# Patient Record
Sex: Female | Born: 1935 | Race: White | Hispanic: No | Smoking: Never smoker
Health system: Southern US, Community
[De-identification: ages and names within clinical notes are randomized; demographics above are authoritative.]

## PROBLEM LIST (undated history)

## (undated) ENCOUNTER — Emergency Department (HOSPITAL_BASED_OUTPATIENT_CLINIC_OR_DEPARTMENT_OTHER): Discharge: 2013-02-16 | Discharge status: Against Medical Advice | Payer: Medicare Other

---

## 2019-06-16 ENCOUNTER — Emergency Department (HOSPITAL_COMMUNITY): Payer: Medicare Other | Admitting: Certified Registered Nurse Anesthetist

## 2019-06-16 ENCOUNTER — Encounter (HOSPITAL_COMMUNITY): Payer: Self-pay | Admitting: Neurology

## 2019-06-16 ENCOUNTER — Inpatient Hospital Stay (HOSPITAL_COMMUNITY): Payer: Medicare Other

## 2019-06-16 ENCOUNTER — Emergency Department (HOSPITAL_COMMUNITY): Payer: Medicare Other

## 2019-06-16 ENCOUNTER — Encounter (HOSPITAL_COMMUNITY): Admission: EM | Disposition: E | Payer: Self-pay | Source: Home / Self Care | Attending: Neurology

## 2019-06-16 ENCOUNTER — Inpatient Hospital Stay (HOSPITAL_COMMUNITY)
Admission: EM | Admit: 2019-06-16 | Discharge: 2019-07-16 | DRG: 023 | Disposition: E | Payer: Medicare Other | Attending: Neurology | Admitting: Neurology

## 2019-06-16 DIAGNOSIS — G9349 Other encephalopathy: Secondary | ICD-10-CM | POA: Diagnosis present

## 2019-06-16 DIAGNOSIS — R414 Neurologic neglect syndrome: Secondary | ICD-10-CM | POA: Diagnosis present

## 2019-06-16 DIAGNOSIS — I495 Sick sinus syndrome: Secondary | ICD-10-CM | POA: Diagnosis present

## 2019-06-16 DIAGNOSIS — Z9181 History of falling: Secondary | ICD-10-CM

## 2019-06-16 DIAGNOSIS — I619 Nontraumatic intracerebral hemorrhage, unspecified: Secondary | ICD-10-CM | POA: Diagnosis present

## 2019-06-16 DIAGNOSIS — I350 Nonrheumatic aortic (valve) stenosis: Secondary | ICD-10-CM | POA: Diagnosis not present

## 2019-06-16 DIAGNOSIS — I272 Pulmonary hypertension, unspecified: Secondary | ICD-10-CM | POA: Diagnosis present

## 2019-06-16 DIAGNOSIS — I69391 Dysphagia following cerebral infarction: Secondary | ICD-10-CM

## 2019-06-16 DIAGNOSIS — I63411 Cerebral infarction due to embolism of right middle cerebral artery: Secondary | ICD-10-CM

## 2019-06-16 DIAGNOSIS — I5032 Chronic diastolic (congestive) heart failure: Secondary | ICD-10-CM | POA: Diagnosis present

## 2019-06-16 DIAGNOSIS — Z79899 Other long term (current) drug therapy: Secondary | ICD-10-CM

## 2019-06-16 DIAGNOSIS — E43 Unspecified severe protein-calorie malnutrition: Secondary | ICD-10-CM | POA: Diagnosis present

## 2019-06-16 DIAGNOSIS — Z7982 Long term (current) use of aspirin: Secondary | ICD-10-CM

## 2019-06-16 DIAGNOSIS — Z79890 Hormone replacement therapy (postmenopausal): Secondary | ICD-10-CM

## 2019-06-16 DIAGNOSIS — Z515 Encounter for palliative care: Secondary | ICD-10-CM | POA: Diagnosis not present

## 2019-06-16 DIAGNOSIS — Z20822 Contact with and (suspected) exposure to covid-19: Secondary | ICD-10-CM | POA: Diagnosis present

## 2019-06-16 DIAGNOSIS — R29810 Facial weakness: Secondary | ICD-10-CM | POA: Diagnosis present

## 2019-06-16 DIAGNOSIS — Z9981 Dependence on supplemental oxygen: Secondary | ICD-10-CM | POA: Diagnosis not present

## 2019-06-16 DIAGNOSIS — H409 Unspecified glaucoma: Secondary | ICD-10-CM | POA: Diagnosis present

## 2019-06-16 DIAGNOSIS — I420 Dilated cardiomyopathy: Secondary | ICD-10-CM | POA: Diagnosis present

## 2019-06-16 DIAGNOSIS — I482 Chronic atrial fibrillation, unspecified: Secondary | ICD-10-CM | POA: Diagnosis present

## 2019-06-16 DIAGNOSIS — I4891 Unspecified atrial fibrillation: Secondary | ICD-10-CM | POA: Diagnosis not present

## 2019-06-16 DIAGNOSIS — M8580 Other specified disorders of bone density and structure, unspecified site: Secondary | ICD-10-CM | POA: Diagnosis present

## 2019-06-16 DIAGNOSIS — I340 Nonrheumatic mitral (valve) insufficiency: Secondary | ICD-10-CM | POA: Diagnosis present

## 2019-06-16 DIAGNOSIS — G8194 Hemiplegia, unspecified affecting left nondominant side: Secondary | ICD-10-CM | POA: Diagnosis present

## 2019-06-16 DIAGNOSIS — Z978 Presence of other specified devices: Secondary | ICD-10-CM

## 2019-06-16 DIAGNOSIS — R7303 Prediabetes: Secondary | ICD-10-CM | POA: Diagnosis present

## 2019-06-16 DIAGNOSIS — I63511 Cerebral infarction due to unspecified occlusion or stenosis of right middle cerebral artery: Secondary | ICD-10-CM | POA: Diagnosis present

## 2019-06-16 DIAGNOSIS — R29719 NIHSS score 19: Secondary | ICD-10-CM | POA: Diagnosis present

## 2019-06-16 DIAGNOSIS — I959 Hypotension, unspecified: Secondary | ICD-10-CM | POA: Diagnosis not present

## 2019-06-16 DIAGNOSIS — Z0189 Encounter for other specified special examinations: Secondary | ICD-10-CM

## 2019-06-16 DIAGNOSIS — Z1389 Encounter for screening for other disorder: Secondary | ICD-10-CM

## 2019-06-16 DIAGNOSIS — I6389 Other cerebral infarction: Secondary | ICD-10-CM | POA: Diagnosis not present

## 2019-06-16 DIAGNOSIS — I10 Essential (primary) hypertension: Secondary | ICD-10-CM | POA: Diagnosis not present

## 2019-06-16 DIAGNOSIS — C7951 Secondary malignant neoplasm of bone: Secondary | ICD-10-CM | POA: Diagnosis present

## 2019-06-16 DIAGNOSIS — Z66 Do not resuscitate: Secondary | ICD-10-CM | POA: Diagnosis not present

## 2019-06-16 DIAGNOSIS — J9601 Acute respiratory failure with hypoxia: Secondary | ICD-10-CM | POA: Diagnosis present

## 2019-06-16 DIAGNOSIS — E872 Acidosis: Secondary | ICD-10-CM | POA: Diagnosis present

## 2019-06-16 DIAGNOSIS — J9690 Respiratory failure, unspecified, unspecified whether with hypoxia or hypercapnia: Secondary | ICD-10-CM

## 2019-06-16 DIAGNOSIS — R471 Dysarthria and anarthria: Secondary | ICD-10-CM | POA: Diagnosis present

## 2019-06-16 DIAGNOSIS — I361 Nonrheumatic tricuspid (valve) insufficiency: Secondary | ICD-10-CM | POA: Diagnosis not present

## 2019-06-16 DIAGNOSIS — I639 Cerebral infarction, unspecified: Secondary | ICD-10-CM | POA: Diagnosis present

## 2019-06-16 DIAGNOSIS — I110 Hypertensive heart disease with heart failure: Secondary | ICD-10-CM | POA: Diagnosis present

## 2019-06-16 DIAGNOSIS — E785 Hyperlipidemia, unspecified: Secondary | ICD-10-CM | POA: Diagnosis present

## 2019-06-16 DIAGNOSIS — R1310 Dysphagia, unspecified: Secondary | ICD-10-CM | POA: Diagnosis present

## 2019-06-16 DIAGNOSIS — R54 Age-related physical debility: Secondary | ICD-10-CM | POA: Diagnosis present

## 2019-06-16 DIAGNOSIS — I9589 Other hypotension: Secondary | ICD-10-CM | POA: Diagnosis not present

## 2019-06-16 DIAGNOSIS — D72829 Elevated white blood cell count, unspecified: Secondary | ICD-10-CM | POA: Diagnosis not present

## 2019-06-16 DIAGNOSIS — I160 Hypertensive urgency: Secondary | ICD-10-CM | POA: Diagnosis present

## 2019-06-16 DIAGNOSIS — E039 Hypothyroidism, unspecified: Secondary | ICD-10-CM | POA: Diagnosis present

## 2019-06-16 HISTORY — PX: IR PERCUTANEOUS ART THROMBECTOMY/INFUSION INTRACRANIAL INC DIAG ANGIO: IMG6087

## 2019-06-16 HISTORY — PX: RADIOLOGY WITH ANESTHESIA: SHX6223

## 2019-06-16 HISTORY — PX: IR CT HEAD LTD: IMG2386

## 2019-06-16 LAB — CBC
HCT: 45.6 % (ref 36.0–46.0)
Hemoglobin: 14.9 g/dL (ref 12.0–15.0)
MCH: 28.9 pg (ref 26.0–34.0)
MCHC: 32.7 g/dL (ref 30.0–36.0)
MCV: 88.5 fL (ref 80.0–100.0)
Platelets: 184 10*3/uL (ref 150–400)
RBC: 5.15 MIL/uL — ABNORMAL HIGH (ref 3.87–5.11)
RDW: 13.8 % (ref 11.5–15.5)
WBC: 8.1 10*3/uL (ref 4.0–10.5)
nRBC: 0 % (ref 0.0–0.2)

## 2019-06-16 LAB — DIFFERENTIAL
Abs Immature Granulocytes: 0.06 10*3/uL (ref 0.00–0.07)
Basophils Absolute: 0 10*3/uL (ref 0.0–0.1)
Basophils Relative: 1 %
Eosinophils Absolute: 0 10*3/uL (ref 0.0–0.5)
Eosinophils Relative: 0 %
Immature Granulocytes: 1 %
Lymphocytes Relative: 8 %
Lymphs Abs: 0.7 10*3/uL (ref 0.7–4.0)
Monocytes Absolute: 0.3 10*3/uL (ref 0.1–1.0)
Monocytes Relative: 4 %
Neutro Abs: 7 10*3/uL (ref 1.7–7.7)
Neutrophils Relative %: 86 %

## 2019-06-16 LAB — COMPREHENSIVE METABOLIC PANEL
ALT: 17 U/L (ref 0–44)
AST: 22 U/L (ref 15–41)
Albumin: 3.6 g/dL (ref 3.5–5.0)
Alkaline Phosphatase: 57 U/L (ref 38–126)
Anion gap: 15 (ref 5–15)
BUN: 14 mg/dL (ref 8–23)
CO2: 21 mmol/L — ABNORMAL LOW (ref 22–32)
Calcium: 8.9 mg/dL (ref 8.9–10.3)
Chloride: 96 mmol/L — ABNORMAL LOW (ref 98–111)
Creatinine, Ser: 0.78 mg/dL (ref 0.44–1.00)
GFR calc Af Amer: >60 mL/min (ref >60)
GFR calc non Af Amer: >60 mL/min (ref >60)
Glucose, Bld: 143 mg/dL — ABNORMAL HIGH (ref 70–99)
Potassium: 4.2 mmol/L (ref 3.5–5.1)
Sodium: 132 mmol/L — ABNORMAL LOW (ref 135–145)
Total Bilirubin: 1.7 mg/dL — ABNORMAL HIGH (ref 0.3–1.2)
Total Protein: 6.7 g/dL (ref 6.5–8.1)

## 2019-06-16 LAB — RAPID URINE DRUG SCREEN, HOSP PERFORMED
Amphetamines: NOT DETECTED
Barbiturates: NOT DETECTED
Benzodiazepines: NOT DETECTED
Cocaine: NOT DETECTED
Opiates: NOT DETECTED
Tetrahydrocannabinol: NOT DETECTED

## 2019-06-16 LAB — URINALYSIS, ROUTINE W REFLEX MICROSCOPIC
Bacteria, UA: NONE SEEN
Bilirubin Urine: NEGATIVE
Glucose, UA: 50 mg/dL — AB
Ketones, ur: 20 mg/dL — AB
Nitrite: NEGATIVE
Protein, ur: 100 mg/dL — AB
Specific Gravity, Urine: 1.039 — ABNORMAL HIGH (ref 1.005–1.030)
pH: 7 (ref 5.0–8.0)

## 2019-06-16 LAB — ETHANOL: Alcohol, Ethyl (B): <10 mg/dL (ref <10)

## 2019-06-16 LAB — PROTIME-INR
INR: 1.1 (ref 0.8–1.2)
Prothrombin Time: 13.3 seconds (ref 11.4–15.2)

## 2019-06-16 LAB — ACETAMINOPHEN LEVEL: Acetaminophen (Tylenol), Serum: <10 ug/mL — ABNORMAL LOW (ref 10–30)

## 2019-06-16 LAB — SALICYLATE LEVEL: Salicylate Lvl: <7 mg/dL — ABNORMAL LOW (ref 7.0–30.0)

## 2019-06-16 LAB — LACTIC ACID, PLASMA: Lactic Acid, Venous: 2.3 mmol/L (ref 0.5–1.9)

## 2019-06-16 LAB — TSH: TSH: 2.977 u[IU]/mL (ref 0.350–4.500)

## 2019-06-16 LAB — APTT: aPTT: 35 seconds (ref 24–36)

## 2019-06-16 LAB — MRSA PCR SCREENING: MRSA by PCR: NEGATIVE

## 2019-06-16 LAB — SARS CORONAVIRUS 2 BY RT PCR (HOSPITAL ORDER, PERFORMED IN ~~LOC~~ HOSPITAL LAB): SARS Coronavirus 2: NEGATIVE

## 2019-06-16 SURGERY — IR WITH ANESTHESIA
Anesthesia: General

## 2019-06-16 MED ORDER — DILTIAZEM HCL ER COATED BEADS 240 MG PO CP24
240.0000 mg | ORAL_CAPSULE | Freq: Every day | ORAL | Status: DC
  Filled 2019-06-16: qty 1

## 2019-06-16 MED ORDER — SUCCINYLCHOLINE CHLORIDE 200 MG/10ML IV SOSY
PREFILLED_SYRINGE | INTRAVENOUS | Status: DC | PRN
  Administered 2019-06-16: 100 mg via INTRAVENOUS

## 2019-06-16 MED ORDER — ACETAMINOPHEN 160 MG/5ML PO SOLN
650.0000 mg | ORAL | Status: DC | PRN
  Administered 2019-06-18: 650 mg
  Filled 2019-06-16: qty 20.3

## 2019-06-16 MED ORDER — PHENYLEPHRINE HCL-NACL 10-0.9 MG/250ML-% IV SOLN
INTRAVENOUS | Status: DC | PRN
  Administered 2019-06-16: 20 ug/min via INTRAVENOUS

## 2019-06-16 MED ORDER — METOPROLOL SUCCINATE ER 25 MG PO TB24
25.0000 mg | ORAL_TABLET | Freq: Every day | ORAL | Status: DC
  Filled 2019-06-16: qty 1

## 2019-06-16 MED ORDER — CHLORHEXIDINE GLUCONATE 0.12% ORAL RINSE (MEDLINE KIT)
15.0000 mL | Freq: Two times a day (BID) | OROMUCOSAL | Status: DC
  Administered 2019-06-16 – 2019-06-21 (×11): 15 mL via OROMUCOSAL

## 2019-06-16 MED ORDER — EPTIFIBATIDE 20 MG/10ML IV SOLN
INTRAVENOUS | Status: AC
  Filled 2019-06-16: qty 10

## 2019-06-16 MED ORDER — ACETAMINOPHEN 325 MG PO TABS: 650.0000 mg | ORAL_TABLET | ORAL | Status: DC | PRN

## 2019-06-16 MED ORDER — CHLORHEXIDINE GLUCONATE CLOTH 2 % EX PADS
6.0000 | MEDICATED_PAD | Freq: Every day | CUTANEOUS | Status: DC
  Administered 2019-06-16 – 2019-06-21 (×6): 6 via TOPICAL

## 2019-06-16 MED ORDER — CLOPIDOGREL BISULFATE 300 MG PO TABS
ORAL_TABLET | ORAL | Status: AC
  Filled 2019-06-16: qty 1

## 2019-06-16 MED ORDER — TIMOLOL MALEATE 0.5 % OP SOLN
1.0000 [drp] | Freq: Two times a day (BID) | OPHTHALMIC | Status: DC
  Administered 2019-06-17 – 2019-06-21 (×10): 1 [drp] via OPHTHALMIC
  Filled 2019-06-16: qty 5

## 2019-06-16 MED ORDER — BRIMONIDINE TARTRATE 0.2 % OP SOLN
1.0000 [drp] | Freq: Two times a day (BID) | OPHTHALMIC | Status: DC
  Administered 2019-06-16 – 2019-06-21 (×11): 1 [drp] via OPHTHALMIC
  Filled 2019-06-16: qty 5

## 2019-06-16 MED ORDER — LEVOTHYROXINE SODIUM 75 MCG PO TABS: 37.5000 ug | ORAL_TABLET | Freq: Every day | ORAL | Status: DC

## 2019-06-16 MED ORDER — SODIUM CHLORIDE 0.9 % IV BOLUS
500.0000 mL | Freq: Once | INTRAVENOUS | Status: AC
  Administered 2019-06-17: 500 mL via INTRAVENOUS

## 2019-06-16 MED ORDER — PROPOFOL 10 MG/ML IV BOLUS
INTRAVENOUS | Status: DC | PRN
  Administered 2019-06-16: 100 mg via INTRAVENOUS

## 2019-06-16 MED ORDER — PROPOFOL 500 MG/50ML IV EMUL
INTRAVENOUS | Status: DC | PRN
  Administered 2019-06-16: 30 ug/kg/min via INTRAVENOUS

## 2019-06-16 MED ORDER — IOHEXOL 240 MG/ML SOLN
150.0000 mL | Freq: Once | INTRAMUSCULAR | Status: AC | PRN
  Administered 2019-06-16: 10 mL via INTRA_ARTERIAL

## 2019-06-16 MED ORDER — ACETAMINOPHEN 650 MG RE SUPP: 650.0000 mg | RECTAL | Status: DC | PRN

## 2019-06-16 MED ORDER — CLEVIDIPINE BUTYRATE 0.5 MG/ML IV EMUL
0.0000 mg/h | INTRAVENOUS | Status: DC
  Administered 2019-06-16: 1 mg/h via INTRAVENOUS
  Filled 2019-06-16: qty 50

## 2019-06-16 MED ORDER — ONDANSETRON HCL 4 MG/2ML IJ SOLN
INTRAMUSCULAR | Status: AC
  Filled 2019-06-16: qty 2

## 2019-06-16 MED ORDER — VERAPAMIL HCL 2.5 MG/ML IV SOLN
INTRAVENOUS | Status: AC
  Filled 2019-06-16: qty 2

## 2019-06-16 MED ORDER — SODIUM CHLORIDE 0.9 % IV SOLN: INTRAVENOUS | Status: DC

## 2019-06-16 MED ORDER — STROKE: EARLY STAGES OF RECOVERY BOOK
Freq: Once | Status: DC
  Filled 2019-06-16: qty 1

## 2019-06-16 MED ORDER — IOHEXOL 300 MG/ML  SOLN
50.0000 mL | Freq: Once | INTRAMUSCULAR | Status: AC | PRN
  Administered 2019-06-16: 50 mL via INTRA_ARTERIAL

## 2019-06-16 MED ORDER — TICAGRELOR 90 MG PO TABS
ORAL_TABLET | ORAL | Status: AC
  Filled 2019-06-16: qty 2

## 2019-06-16 MED ORDER — SENNOSIDES-DOCUSATE SODIUM 8.6-50 MG PO TABS: 1.0000 | ORAL_TABLET | Freq: Every evening | ORAL | Status: DC | PRN

## 2019-06-16 MED ORDER — PROPOFOL 1000 MG/100ML IV EMUL
0.0000 ug/kg/min | INTRAVENOUS | Status: DC
  Administered 2019-06-16: 30 ug/kg/min via INTRAVENOUS
  Administered 2019-06-17: 10 ug/kg/min via INTRAVENOUS
  Administered 2019-06-17: 25 ug/kg/min via INTRAVENOUS
  Filled 2019-06-16 (×4): qty 100

## 2019-06-16 MED ORDER — PANTOPRAZOLE SODIUM 40 MG IV SOLR
40.0000 mg | INTRAVENOUS | Status: DC
  Administered 2019-06-16 – 2019-06-17 (×2): 40 mg via INTRAVENOUS
  Filled 2019-06-16 (×2): qty 40

## 2019-06-16 MED ORDER — ROCURONIUM BROMIDE 10 MG/ML (PF) SYRINGE
PREFILLED_SYRINGE | INTRAVENOUS | Status: DC | PRN
  Administered 2019-06-16: 40 mg via INTRAVENOUS

## 2019-06-16 MED ORDER — TIROFIBAN HCL IN NACL 5-0.9 MG/100ML-% IV SOLN
INTRAVENOUS | Status: AC
  Filled 2019-06-16: qty 100

## 2019-06-16 MED ORDER — IOHEXOL 350 MG/ML SOLN
100.0000 mL | Freq: Once | INTRAVENOUS | Status: AC | PRN
  Administered 2019-06-16: 100 mL via INTRAVENOUS

## 2019-06-16 MED ORDER — ORAL CARE MOUTH RINSE
15.0000 mL | OROMUCOSAL | Status: DC
  Administered 2019-06-16 – 2019-06-21 (×52): 15 mL via OROMUCOSAL

## 2019-06-16 MED ORDER — LATANOPROST 0.005 % OP SOLN
1.0000 [drp] | Freq: Every day | OPHTHALMIC | Status: DC
  Administered 2019-06-17 – 2019-06-21 (×5): 1 [drp] via OPHTHALMIC
  Filled 2019-06-16: qty 2.5

## 2019-06-16 MED ORDER — IOHEXOL 240 MG/ML SOLN
INTRAMUSCULAR | Status: AC
  Filled 2019-06-16: qty 200

## 2019-06-16 MED ORDER — CLEVIDIPINE BUTYRATE 0.5 MG/ML IV EMUL
INTRAVENOUS | Status: AC
  Administered 2019-06-16: 1 mg/h via INTRAVENOUS
  Filled 2019-06-16: qty 50

## 2019-06-16 MED ORDER — INSULIN ASPART 100 UNIT/ML ~~LOC~~ SOLN
2.0000 [IU] | SUBCUTANEOUS | Status: DC
  Administered 2019-06-16 – 2019-06-17 (×3): 2 [IU] via SUBCUTANEOUS
  Administered 2019-06-17: 4 [IU] via SUBCUTANEOUS
  Administered 2019-06-17 – 2019-06-18 (×3): 2 [IU] via SUBCUTANEOUS
  Administered 2019-06-18: 4 [IU] via SUBCUTANEOUS
  Administered 2019-06-19 – 2019-06-21 (×11): 2 [IU] via SUBCUTANEOUS
  Administered 2019-06-21: 4 [IU] via SUBCUTANEOUS

## 2019-06-16 MED ORDER — ASPIRIN 81 MG PO CHEW
CHEWABLE_TABLET | ORAL | Status: AC
  Filled 2019-06-16: qty 1

## 2019-06-16 NOTE — Procedures (Signed)
INTERVENTIONAL NEURORADIOLOGY BRIEF POSTPROCEDURE NOTE  DIAGNOSTIC CEREBRAL ANGIOGRAM AND MECHANICAL THROMBECTOMY  Attending: Dr. Pedro Earls  Assistant: None  Diagnosis: Right M1/MCA occlusion  Access site: Right common femoral artery  Access closure: 58F angioseal  Anesthesia: General  Medication used: refer to anesthesia documentation.  Complications: None  Estimated blood loss: 100 mL.  Specimen: None  Findings: There was a mid M1/MCA occlusion. MT performed with combined aspiration + stent retriever Scientist, research (life sciences)). Total 1 pass with complete recanalization (TICI3). No embolus to new territory. No bleed on post procedural flat panel CT.  The patient tolerated the procedure well without incident or complication and is in stable condition.  Patient remained intubated due to unknown covid-19 status.

## 2019-06-16 NOTE — Progress Notes (Signed)
Called by Dr. Jeannine Boga in radiology with a concern for basal ganglia bleed in the revascularized territory He is recommending CT head for follow-up.  I will order CT head.  -- Amie Portland, MD [facility] Pager: [phone] If 7pm to 7am, please call on call as listed on AMION.

## 2019-06-16 NOTE — Anesthesia Preprocedure Evaluation (Signed)
Anesthesia Evaluation  Patient identified by MRN, date of birth, ID bandPreop documentation limited or incomplete due to emergent nature of procedure.  Airway Mallampati: II   Neck ROM: full    Dental   Pulmonary    breath sounds clear to auscultation       Cardiovascular  Rhythm:regular Rate:Normal     Neuro/Psych Code Stroke: L side weakness. L side facial droop.  Dysarthria. CVA    GI/Hepatic   Endo/Other    Renal/GU      Musculoskeletal   Abdominal   Peds  Hematology   Anesthesia Other Findings   Reproductive/Obstetrics                             Anesthesia Physical Anesthesia Plan  ASA: IV and emergent  Anesthesia Plan: General   Post-op Pain Management:    Induction: Intravenous  PONV Risk Score and Plan: 3 and Ondansetron, Dexamethasone and Treatment may vary due to age or medical condition  Airway Management Planned: Oral ETT  Additional Equipment:   Intra-op Plan:   Post-operative Plan: Possible Post-op intubation/ventilation  Informed Consent: I have reviewed the patients History and Physical, chart, labs and discussed the procedure including the risks, benefits and alternatives for the proposed anesthesia with the patient or authorized representative who has indicated his/her understanding and acceptance.       Plan Discussed with: Anesthesiologist, CRNA and Surgeon  Anesthesia Plan Comments:         Anesthesia Quick Evaluation

## 2019-06-16 NOTE — ED Triage Notes (Signed)
Pt bib [facility] from home w/ c/o L sided weakness, and L sided facial droop. LKW 0600 today.

## 2019-06-16 NOTE — Code Documentation (Signed)
Code Stroke Documentation  Code stroke was activated at 1701 for Danielle Malone having new onset of left facial droop, severe dysarthria, and left sided paralysis. Pt son states he saw her this morning at 0600 in her usual state of health-per son pt is independent at home. At 1630 he returned home to find her with the above symptoms and called [facility]. Upon arrival to [facility] ED, pt was found to have a right gaze preference, complete hemianopia, partial left facial droop, flaccid left side, sensory loss, mild aphasia and moderate dysarthria. NIH 19 upon arrival. CT, CTA & CTP completed. Decision made to activate IR. Pt's watch, bracelet, tshirt, and undergarment were placed in a belongings bag and handed off to P. Dhers, Therapist, sports.

## 2019-06-16 NOTE — Progress Notes (Signed)
CTH with HT of the Rt basal ganglia SBP goal 120-140 Repeat CTH 0800 hrs  -- Amie Portland, MD [facility]

## 2019-06-16 NOTE — H&P (Addendum)
Neurology History and Physical   Referring Physician: Dr. Tyrone Nine    Chief Complaint: Acute onset of left hemiplegia  HPI: Danielle Malone is an 86 y.o. female who was in her USOH on awakening at 6 AM today. Her son left the house then returned in the afternoon and found her with left facial droop, severe dysarthria and left sided paralysis. EMS was called and on arrival noted the same deficits. A Code Stroke was called and she was emergently transported to the ED at [facility]. On arrival she had severe dysarthria, left facial droop and left hemiplegia. She was able to answer basic orientation questions. She has a history of chronic atrial fibrillation, with rate controlled; she is not on a blood thinner due to falls risk. Regarding her ADL's, she is fully independent at home and walks on her own.   Home medications include ASA and atorvastatin.   LSN: 0600 tPA Given: No: Out of time window MRS: 0 NIHSS: 18  PMHx Chronic atrial fibrillation Pulmonary HTN Essential HTN HLD CKD stage 3 O2 dependent at night Glaucoma Sacral fracture SSS  PSHx: Breast biopsy on left Breast cyst aspiration on right  Social History: Never smoked. No EtOH use.   FHx: No Known Problems Mother  . No Known Problems Father  . Diabetes Sister  . Coronary artery disease Sister  . Heart attack Brother  . Cancer Other  . Uterine cancer Other  . Diabetes Other  . Coronary artery disease Son  . Cancer Son  . Stroke Neg Hx   Allergies: Sulfamethoxazole  Home Medications: . alendronate (FOSAMAX) 70 MG tablet, TAKE 1 TABLET EVERY WEEK FOR BONES TAKE WITH A FULL GLASS OF WATER AND DO NOT EAT OR LIE DOWN FOR 30 MINUTES, Disp: 12 tablet, Rfl: 3 . aspirin 81 MG EC tablet *ANTIPLATELET*, 162 mg. Take two 81 mg tablets daily , Disp: 60 tablet, Rfl: 0 . atorvastatin (LIPITOR) 20 MG tablet, TAKE 1 TABLET BY MOUTH NIGHTLY FOR CHOLESTEROL, Disp: 90 tablet, Rfl: 3 . brimonidine-timolol (COMBIGAN) 0.2-0.5 % ophthalmic  solution, Place 1 drop into the right eye every 12 hours. , Disp: , Rfl:  . calcium carbonate-vitamin D3 600 mg(1,500mg ) -800 unit Tab, Take 1 tablet by mouth daily., Disp: , Rfl:  . dilTIAZem (CARTIA XT, CARDIZEM CD) 240 MG 24 hr capsule, TAKE 1 CAPSULE BY MOUTH EVERY DAY, Disp: 90 capsule, Rfl: 3 . ergocalciferol (VITAMIN D2) 1,250 mcg (50,000 unit) capsule, Take 50,000 Units by mouth once a week., Disp: , Rfl:  . estradioL (ESTRACE) 0.01 % (0.1 mg/gram) vaginal cream, Place 2 g vaginally every other day for 30 days. Place a pea size on the tip of the finger every other day, Disp: 42.5 g, Rfl: 5 . ferrous sulfate 324 mg (65 mg iron) TbEC, Take by mouth nightly., Disp: , Rfl:  . furosemide (LASIX) 20 MG tablet, TAKE 1 TABLET BY MOUTH EVERY DAY FOR LEGSWELLING, Disp: 90 tablet, Rfl: 3 . levothyroxine (SYNTHROID) 25 MCG tablet, Take 1.5 tablets (37.5 mcg total) by mouth Daily at 0600., Disp: 135 tablet, Rfl: 3 . LUMIGAN 0.01 % ophthalmic drops, Place 1 drop into both eyes nightly. , Disp: , Rfl:  . metoPROLOL succinate (TOPROL-XL) 25 MG 24 hr tablet, TAKE 1 TABLET BY MOUTH EVERY DAY, Disp: 90 tablet, Rfl: 3 . polyethylene glycol (MIRALAX) 17 gram/dose powder, Take 17 g by mouth daily as needed for Constipation. (Patient taking differently: Take 17 g by mouth as needed. ), Disp: 510 g, Rfl: 2 .  traMADol (ULTRAM) 50 mg tablet, Take 1 tablet (50 mg total) by mouth every 8 (eight) hours as needed., Disp: 30 tablet, Rfl: 1 . spironolactone (ALDACTONE) 25 MG tablet, Take 1 tablet (25 mg total) by mouth daily. (Patient taking differently: Take 25 mg by mouth as needed. ), Disp: 30 tablet, Rfl: 3  ROS: Deferred in the context of acuity of presentation.   Physical Examination: There were no vitals taken for this visit.  HEENT: [place]/AT Lungs: Respirations unlabored Ext: No edema   Neurologic Examination: Mental Status: Awake and alert. Left hemineglect and anosognosia. Dysarthric, hypophonic speech with  sparse output. In this context her speech is fluent. Naming intact. Able to comprehend all basic motor commands. Partially oriented to place and time.  Cranial Nerves: II:  Left visual field cut. PERRL.  III,IV, VI: No ptosis. Right gaze preference; unable to volitionally cross midline to the left; this can be overcome by oculocephalic maneuver.   V,VII: Left facial droop. Insensate on the left side of face.  VIII: Hearing intact to voice IX,X: Hypophonic speech XI: Severe weakness on the left. Head preferentially rotated to the right.  XII: Right tongue deviation Motor: RUE 5/5 RLE 5/5 LUE 0/5 with increased tone of flexors at wrist and elbow.  LLE 3/5 Sensory: Insensate to FT, LUE and LLE Deep Tendon Reflexes:  2+ bilateral upper extremities. Trace patellars bilaterally 0 achilles bilaterally Toes equivocal Cerebellar: No right sided ataxia. Unable to assess on the left.  Gait: Unable to assess   CT head: Acute ischemic infarction changes involving the right basal ganglia, internal capsule and anterior right temporal lobe. ASPECTS is 6. Subtle hyperdensity is questioned in the region of the distal M1 right MCA, which may reflect thrombus. Correlate with pending CTA Head/neck. No evidence of acute intracranial hemorrhage. Background mild generalized parenchymal atrophy and chronic small vessel ischemic disease.  CTA neck: 1. The common carotid, internal carotid and vertebral arteries are patent within the neck without hemodynamically significant stenosis. Mild atherosclerotic plaque within the carotid bifurcations bilaterally. 2. The left internal carotid artery appears to originate distal to the left subclavian takeoff. 3. 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 4. Multifocal sclerotic lesions within the cervical and visualized thoracic spine suspicious for osseous metastatic disease. 5.  Partially imaged pleural effusions (greater on the right).  CTA head: Abrupt occlusion of the distal M1 right middle cerebral artery. There is non opacification of adjacent proximal M2 right MCA branch vessels. There is poor opacification of more distal right MCA branch vessels.  CT perfusion head: The perfusion software detects a 10 mL region of core infarction within the right MCA vascular territory, which is likely underestimated given findings on noncontrast head CT performed earlier the same day. The perfusion software detects a 107 mL region of hypoperfusion within the right MCA vascular territory utilizing a Tmax>6 seconds threshold. Reported mismatch volume: 97 mL.  04/27/19 echocardiogram at outside facility: Left ventricular systolic function is normal.   LV ejection fraction = 60-65%.   No segmental wall motion abnormalities seen in the left ventricle   Left ventricular filling pattern is indeterminate.   The right ventricle is mildly dilated.   The right ventricular systolic function is moderately reduced.   The left atrium is severely dilated.   The right atrium is severely dilated.   There is mild to moderate aortic stenosis.   There is moderate mitral regurgitation.    Assessment:  86 y.o. female presenting with acute onset of left hemiplegia, left hemineglect and dysarthria 1. Imaging reveals a right M1 occlusion. There is early core infarction seen both on CTP and CT. A large penumbra of at risk tissue is measured at 97 mL.  2. Not a tPA candidate due to time criteria. 3. The patient is a VIR candidate. Risks/benefits of the procedure were discussed extensively with the patient's son, including approximately 50% chance of significant improvement relative to an approximate 10% chance of subarachnoid hemorrhage with possibility of significant worsening including death. The patient is unable to provide informed consent due to left hemineglect and anosognosia. The  patient's son expressed understanding and provided informed consent to proceed with VIR. All questions answered.  4. Stroke Risk Factors - Chronic atrial fibrillation, HTN and HLD  Plan: 1. In [place]. Following thrombectomy, the patient will be admitted to the ICU under the Neurology service. Orders to include frequent neuro checks and BP management.  2. No antiplatelet medications or anticoagulants for at least 24 hours following VIR.  3. DVT prophylaxis with SCDs.  4. Will need escalation of antiplatelet therapy if follow up CT at 24 hours is negative for hemorrhagic conversion.  5. MRI brain  6. PT/OT/Speech.  7. NPO until passes swallow evaluation.  8. Telemetry monitoring 9. Fasting lipid panel, HgbA1c 10. Will need MRI of cervical and thoracic spine with and without contrast for assessment of the multifocal sclerotic lesions within the cervical and visualized thoracic spine on CT, which are suspicious for osseous metastatic disease. 11. Pulmonology consult for pleural effusions seen on CT (partially intersected by the imaging planes).  60 minutes spent in the emergent neurological evaluation and management of this critically ill patient.   @Electronically  signed: Dr. Kerney Elbe 07/05/2019, 5:25 PM

## 2019-06-16 NOTE — Anesthesia Procedure Notes (Signed)
Procedure Name: Intubation Date/Time: 07/05/2019 6:25 PM Performed by: Shirlyn Goltz, CRNA Pre-anesthesia Checklist: Patient identified, Emergency Drugs available, Suction available and Patient being monitored Patient Re-evaluated:Patient Re-evaluated prior to induction Oxygen Delivery Method: Circle system utilized Preoxygenation: Pre-oxygenation with 100% oxygen Induction Type: IV induction and Rapid sequence Laryngoscope Size: Mac and 3 Grade View: Grade I Tube type: Oral Tube size: 7.0 mm Number of attempts: 1 Airway Equipment and Method: Stylet Placement Confirmation: ETT inserted through vocal cords under direct vision,  positive ETCO2 and breath sounds checked- equal and bilateral Secured at: 21 cm Tube secured with: Tape Dental Injury: Teeth and Oropharynx as per pre-operative assessment

## 2019-06-16 NOTE — Progress Notes (Signed)
[facility] Progress Note Patient Name: Danielle Malone DOB: [birth year] MRN: [id number]   Date of Service  06/20/2019  HPI/Events of Note  Lactic Acid = 2.3.   eICU Interventions  Plan: 1. Bolus with 0.9 NaCl 500 mLIV over 30 minutes now.      Intervention Category Major Interventions: Acid-Base disturbance - evaluation and management  [name] 06/21/2019, 11:59 PM

## 2019-06-16 NOTE — Sedation Documentation (Signed)
8Fr. angioseal to right groin

## 2019-06-16 NOTE — Consult Note (Signed)
..   NAME:  Danielle Malone, MRN:  [id number], DOB:  05-12-1925, LOS: 0 ADMISSION DATE:  06/18/2019, CONSULTATION DATE:  06/28/2019 REFERRING MD:  Cheral Marker MD, CHIEF COMPLAINT:  Left hemiplegia s/p thrombectomy  Brief History   86 yr old F w/ PMHx sig for Afib, Pulm HTN, CKD stage 3, SSS presented on 06/01 with left hemiplegia LSN 0600  found to have an acute ischemic stroke involving distal M1Right MCA. Was not a candidate for TPA. POD 0 cerebral angiogram & mechanical thrombectomy of right M1 MCA with resolution of occlusive thrombus. MRI to follow.PCCM consulted for ventilator mgmt. History of present illness   ( History obtained from review of EMR and acct of other providers as patient is s/p thrombectomy and intubated acutely encephalopathic and on propofol)  86 yr old F w/ PMHx sig for chronic Afib, Pulm HTN, CKD stage 3, SSS presented on 06/01 with left hemiplegia, at her baseline she is able to perform her ADLs and walks on her own without a walker. She lives with her son and was LSN 0600  On 06/28/2019 Her son left the house and returned in the afternoon to find he with a left facial droop, severe dysarthria and left side paralysis.  EMS was called and pt arrived with the same deficits unable to answer basic questions. Due to her fall risk she was not on anticoagulation for her chronic Afib.  Code stroke was called on arrival and Pt was evaluated by neurology.  She was outside of the window of TPA and initial CTH was negative for hemorrhage and showed acute ischemic infacr of right basal ganglia and internal capsule wiuth right temporal lobew/ a subtle hyperdensity in M1 region. CTA confirmed abrupt occlusion involving distal M1 of the Right MCA. Pt was endotracheally intubated by Anesthesia prior to Sx intervention POD 0 cerebral angiogram & mechanical thrombectomy of right M1 MCA with resolution of occlusive thrombus.  MRI to follow. PCCM consulted for ventilator mgmt.  Past Medical History   Chronic atrial fibrillation Pulmonary HTN Essential HTN HLD CKD stage 3 O2 dependent at night Glaucoma Sacral fracture SSS  Home medications: ASA and Atorvastatin per Neurology documentation. None available on EMR at time of consult Significant Hospital Events   S/p mechanical thrombectomy  Consults:  Anesthesia IR PCCM  Procedures:  Endotracheally intubated POD 0 mechanical thrombectomy  Significant Diagnostic Tests:  07/12/2019 CT head: Acute ischemic infarction changes involving the right basal ganglia, internal capsule and anterior right temporal lobe. ASPECTS is 6. Subtle hyperdensity is questioned in the region of the distal M1 right MCA, which may reflect thrombus. Correlate with pending CTA Head/neck. No evidence of acute intracranial hemorrhage. Background mild generalized parenchymal atrophy and chronic small vessel ischemic disease.    06/25/2019 CTA NECK FINDINGS Aortic arch: The left internal carotid artery appears to originate beyond the origin of the left subclavian takeoff. Atherosclerotic calcification within the visualized aortic arch and proximal major branch vessels of the neck. No hemodynamically significant innominate or proximal subclavian artery stenosis. Right carotid system: CCA and ICA patent within the neck without significant stenosis (50% or greater). Mild atherosclerotic plaque within the carotid bifurcation. Left carotid system: CCA and ICA patent within the neck without significant stenosis (50% or greater). Mild atherosclerotic plaque within the carotid bifurcation. Vertebral arteries: Codominant and patent within the neck without significant stenosis. 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 Skeleton: Numerous sclerotic lesions within the cervical and visualized thoracic spine suspicious for osseous metastatic  disease. Cervical spondylosis. Other neck: No neck mass or cervical lymphadenopathy. Upper chest: Partially imaged bilateral pleural effusions (greater on the right with associated compressive atelectasis.  CTA HEAD FINDINGS Anterior circulation: The intracranial internal carotid arteries are patent without significant stenosis. Mild calcified plaque within these vessels. There is abrupt occlusion of the distal M1 right middle cerebral artery. There is non opacification of the adjacent proximal right M2 branch vessels. There is poor opacification of distal M2 and more distal right MCA branch vessels. The M1 left middle cerebral artery is patent without significant stenosis. No left M2 proximal branch occlusion is identified. The anterior cerebral arteries are patent without high-grade proximal stenosis. No intracranial aneurysm is identified.  Posterior circulation: The intracranial vertebral arteries are patent without significant stenosis, as is the basilar artery. The posterior cerebral arteries are patent proximally without significant stenosis. Posterior communicating arteries are present bilaterally. Venous sinuses: Within limitations of contrast timing, no convincing thrombus. Anatomic variants: As described CT Brain Perfusion Findings:  ASPECTS: 6   ECHO on 04/27/19 Left ventricular systolic function is normal.  LV ejection fraction = 60-65%.  No segmental wall motion abnormalities seen in the left ventricle  Left ventricular filling pattern is indeterminate.  The right ventricle is mildly dilated.  The right ventricular systolic function is moderately reduced.  The left atrium is severely dilated.  The right atrium is severely dilated.  There is mild to moderate aortic stenosis.  There is moderate mitral regurgitation.  Micro Data:  07/11/2019: SARSCOV2--> negative MRSA PCR sent on 06/01--> pending at time of consult  Antimicrobials:  none   Interim  history/subjective:  On evaluation post thrombectomy pt is rousable on propofol and cleviprex gtt SBP in 160 s moving her RUE. In no visible distress O2 sat 100% on FiO2 100% PEEP 5--> discussed with RT will wean FiO2 to keep O2 sat >92%  Objective   Blood pressure (!) 130/115, pulse (!) 104, resp. rate 20, SpO2 100 %.    Vent Mode: PRVC FiO2 (%):  [100 %] 100 % Set Rate:  [16 bmp] 16 bmp Vt Set:  [420 mL] 420 mL PEEP:  [5 cmH20] 5 cmH20 Plateau Pressure:  [18 cmH20] 18 cmH20   Intake/Output Summary (Last 24 hours) at 07/06/2019 2021 Last data filed at 07/05/2019 2014 Gross per 24 hour  Intake 515.15 ml  Output --  Net 515.15 ml   There were no vitals filed for this visit.  Examination: General: thin female in no acute distress, sedated HENT: endotracheally intubated, moist oral mucosa no chelosis, normocephalic atraumatic Lungs: clear to auscultation bilaterally no wheezing on exp phase, no crackles at bases Cardiovascular: S1 and S2 irregularly irregular  Abdomen: soft scaphoid abdomen no grimace on plapation Extremities: b/l lower extremities have non pitting edema, no SCDs on at time of eval Neuro: moves RUE, and head not opening eyes or responding to verbal stimuli on propofol gtt RASS 0 GU: external urinary catheter in place  Assessment & Plan:  1. Acute encephalopathy s/p ischemic CVA of Right M1 MCA s/p mechanical thrombectomy  Did not receive TPA- was outside of window No record of antiplatelet in IR procedure- mechanical thrombectomy LSN 0600 Moving RUE post op and withdrawing from pain Neurology and IR on board Pt received RSI Propofol 100 mg, Rocuronium 40 mg, Succinylcholine 100 mg Pt was on Phenylephrine for a brief period Plan: RASS goal 0  to -1 Continue on Propofol Per protocol check CK and triglycerides  MRI per Neurology and IR to follow  2. Acute Respiratory insufficiency secondary to encephalopathy Airway protection B/l pleural effusions R>L seen on  CTA prior to endotracheal intubation Physical exam had clear breath sounds no crackles CXR showed no effusions present Significant enlarged cardiac silhouette Plan: Continue on full mechanical vent support PRVC TV 8 cc/kg Titrate FiO2 to keep O2 sat >92% Once cleared by Neurology, if pt meets criteria --> Daily assessment for SBT Consider extubation to NIPPV as she has a h/o Pulm htn, cardiomegaly and effusions that resolved with PPV.--> HFPEF  3. BP control post mechanical thrombectomy Dilated cardiomyopathy with preserved LV fn EF 60-65%  with reduced RV fn and h/o Pulm HTN Also mild to moderate AS with moderate MR on last ECHO Has a h/o HTN and HLD Plan:  Avoid hypertension Continue on Cleviprex for goal SBP 140-120 mmHg F/u TSH F/u BNP F/u Lipid panel and resume statin per Neuro  4. Anion Gap Metabolic Acidosis CO2 21 AG 15  BG 143 mg/dl Bladder full on KUB GFR >60 Has a h/o CKD stage 3 + protein and glucose in urine with some ketones ETOH <10 Plan: Insert indwelling foley catheter Will check lactate, salicylate level Phase 1 insulin protocol   Best practice:  Diet: NPO for now if remains intubated start TF Pain/Anxiety/Delirium protocol (if indicated): on propofol gtt VAP protocol (if indicated): yes DVT prophylaxis: SCDs only GI prophylaxis: Protonix Glucose control: ISS goal BG 140-180 mg/dl Mobility: bedrest Code Status: Full Family Communication: Pt lives with Son per Neuro documentation by Unk Pinto is listed as contact [phone] Disposition:   Labs   CBC: Recent Labs  Lab 07/12/2019 1724  WBC 8.1  NEUTROABS 7.0  HGB 14.9  HCT 45.6  MCV 88.5  PLT [id number]    Basic Metabolic Panel: Recent Labs  Lab 07/04/2019 1724  NA 132*  K 4.2  CL 96*  CO2 21*  GLUCOSE 143*  BUN 14  CREATININE 0.78  CALCIUM 8.9   GFR: CrCl cannot be calculated (Unknown ideal weight.). Recent Labs  Lab 07/03/2019 1724  WBC 8.1    Liver Function Tests: Recent  Labs  Lab 07/13/2019 1724  AST 22  ALT 17  ALKPHOS 57  BILITOT 1.7*  PROT 6.7  ALBUMIN 3.6   No results for input(s): LIPASE, AMYLASE in the last 168 hours. No results for input(s): AMMONIA in the last 168 hours.  ABG No results found for: PHART, PCO2ART, PO2ART, HCO3, TCO2, ACIDBASEDEF, O2SAT   Coagulation Profile: Recent Labs  Lab 07/11/2019 1724  INR 1.1    Cardiac Enzymes: No results for input(s): CKTOTAL, CKMB, CKMBINDEX, TROPONINI in the last 168 hours.  HbA1C: No results found for: HGBA1C  CBG: No results for input(s): GLUCAP in the last 168 hours.  Review of Systems:   Marland KitchenMarland KitchenReview of Systems  Unable to perform ROS: Intubated    Past Medical History  Chronic atrial fibrillation Pulmonary HTN Essential HTN HLD CKD stage 3 O2 dependent at night Glaucoma Sacral fracture SSS  Surgical History   Breast biopsy on left Breast cyst aspiration on right  Social History  Never smoker and no ETOH use Lives with her son.  Fully independent at baseline and walks on her own Family History   No Known Problems Mother  . No Known Problems Father  . Diabetes Sister  . Coronary artery disease Sister  . Heart attack Brother  . Cancer  Other  . Uterine cancer Other  . Diabetes Other  . Coronary artery disease Son  . Cancer Son  . Stroke Neg Hx   Allergies: Sulfamethoxazole  Home Medications  Prior to Admission medications   Not on File      I, Dr Seward Carol have personally reviewed patient's available data, including medical history, events of note, physical examination and test results as part of my evaluation. I have discussed with other care providers such as pharmacist, RN and Elink.  In addition,  I personally evaluated patient The patient is critically ill with multiple organ systems failure and requires high complexity decision making for assessment and support, frequent evaluation and titration of therapies, application of advanced monitoring  technologies and extensive interpretation of multiple databases.   Critical Care Time devoted to patient care services described in this note is 11 Minutes. This time reflects time of care of this signee Dr Seward Carol. This critical care time does not reflect procedure time, or teaching time or supervisory time but could involve care discussion time   CC TIME: 55  minutes CODE STATUS: FULL DISPOSITION: ICU PROGNOSIS: Guarded  Dr. Seward Carol Pulmonary Critical Care Medicine  07/10/2019 9:09 PM   Critical care time: 55 mins

## 2019-06-16 NOTE — ED Provider Notes (Signed)
[place] EMERGENCY DEPARTMENT Provider Note   CSN: [id number] Arrival date & time:     An emergency department physician performed an initial assessment on this suspected stroke patient at 15.  History Chief Complaint  Patient presents with  . Code Stroke    Danielle Malone is a 86 y.o. female.  86 yo F with a cc of left sided weakness and right sided gaze preference.  Arrived as a code stroke.  Lvl V caveat acuity of condition.   The history is provided by the patient.  Illness Severity:  Moderate Onset quality:  Sudden Duration:  2 hours Timing:  Constant Progression:  Unchanged Chronicity:  New Associated symptoms: no chest pain, no congestion, no fever, no headaches, no myalgias, no nausea, no rhinorrhea, no shortness of breath, no vomiting and no wheezing        History reviewed. No pertinent past medical history.  Patient Active Problem List   Diagnosis Date Noted  . 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 No pertinent surgical history.   OB History   No obstetric history on file.     No family history on file.  Social History   Tobacco Use  . Smoking status: Not on file  Substance Use Topics  . Alcohol use: Not on file  . Drug use: Not on file    Home Medications Prior to Admission medications   Not on File    Allergies    Patient has no allergy information on record.  Review of Systems   Review of Systems  Constitutional: Negative for chills and fever.  HENT: Negative for congestion and rhinorrhea.   Eyes: Negative for redness and visual disturbance.  Respiratory: Negative for shortness of breath and wheezing.   Cardiovascular: Negative for chest pain and palpitations.  Gastrointestinal: Negative for nausea and vomiting.  Genitourinary: Negative for dysuria and urgency.  Musculoskeletal: Negative for arthralgias and myalgias.  Skin: Negative for pallor and wound.  Neurological: Negative for  dizziness and headaches.    Physical Exam Updated Vital Signs BP 111/77 Comment: cleviprex decreased  Pulse 87   Resp 18   SpO2 100%   Physical Exam Vitals and nursing note reviewed.  Constitutional:      General: She is not in acute distress.    Appearance: She is well-developed. She is not diaphoretic.  HENT:     Head: Normocephalic and atraumatic.  Eyes:     Pupils: Pupils are equal, round, and reactive to light.  Cardiovascular:     Rate and Rhythm: Normal rate and regular rhythm.     Heart sounds: No murmur. No friction rub. No gallop.   Pulmonary:     Effort: Pulmonary effort is normal.     Breath sounds: No wheezing or rales.  Abdominal:     General: There is no distension.     Palpations: Abdomen is soft.     Tenderness: There is no abdominal tenderness.  Musculoskeletal:        General: No tenderness.     Cervical back: Normal range of motion and neck supple.  Skin:    General: Skin is warm and dry.  Neurological:     Mental Status: She is alert and oriented to person, place, and time.     Comments: Right sided gaze preference. L sided weakness.   Psychiatric:        Behavior: Behavior normal.     ED Results / Procedures / Treatments  Labs (all labs ordered are listed, but only abnormal results are displayed) Labs Reviewed  CBC - Abnormal; Notable for the following components:      Result Value   RBC 5.15 (*)    All other components within normal limits  COMPREHENSIVE METABOLIC PANEL - Abnormal; Notable for the following components:   Sodium 132 (*)    Chloride 96 (*)    CO2 21 (*)    Glucose, Bld 143 (*)    Total Bilirubin 1.7 (*)    All other components within normal limits  URINALYSIS, ROUTINE W REFLEX MICROSCOPIC - Abnormal; Notable for the following components:   Specific Gravity, Urine 1.039 (*)    Glucose, UA 50 (*)    Hgb urine dipstick MODERATE (*)    Ketones, ur 20 (*)    Protein, ur 100 (*)    Leukocytes,Ua SMALL (*)    All other  components within normal limits  SARS CORONAVIRUS 2 BY RT PCR (HOSPITAL ORDER, [facility])  MRSA PCR SCREENING  ETHANOL  PROTIME-INR  APTT  DIFFERENTIAL  RAPID URINE DRUG SCREEN, HOSP PERFORMED  HEMOGLOBIN A1C  LIPID PANEL  ACETAMINOPHEN LEVEL  SALICYLATE LEVEL  TSH  I-STAT CHEM 8, ED    EKG None  Radiology CT CEREBRAL PERFUSION W CONTRAST  Result Date: 06/18/2019 CLINICAL DATA:  Neuro deficit, acute, stroke suspected. EXAM: CT ANGIOGRAPHY HEAD AND NECK CT PERFUSION BRAIN TECHNIQUE: Multidetector CT imaging of the head and neck was performed using the standard protocol during bolus administration of intravenous contrast. Multiplanar CT image reconstructions and MIPs were obtained to evaluate the vascular anatomy. Carotid stenosis measurements (when applicable) are obtained utilizing NASCET criteria, using the distal internal carotid diameter as the denominator. Multiphase CT imaging of the brain was performed following IV bolus contrast injection. Subsequent parametric perfusion maps were calculated using RAPID software. CONTRAST:  100 mL Omnipaque 350 intravenous contrast COMPARISON:  Noncontrast head CT performed earlier the same day 06/29/2019 FINDINGS: CTA NECK FINDINGS Aortic arch: The left internal carotid artery appears to originate beyond the origin of the left subclavian takeoff. Atherosclerotic calcification within the visualized aortic arch and proximal major branch vessels of the neck. No hemodynamically significant innominate or proximal subclavian artery stenosis. Right carotid system: CCA and ICA patent within the neck without significant stenosis (50% or greater). Mild atherosclerotic plaque within the carotid bifurcation. Left carotid system: CCA and ICA patent within the neck without significant stenosis (50% or greater). Mild atherosclerotic plaque within the carotid bifurcation. Vertebral arteries: Codominant and patent within the neck without  significant stenosis. 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 Skeleton: Numerous sclerotic lesions within the cervical and visualized thoracic spine suspicious for osseous metastatic disease. Cervical spondylosis. Other neck: No neck mass or cervical lymphadenopathy. Upper chest: Partially imaged bilateral pleural effusions (greater on the right with associated compressive atelectasis. Review of the MIP images confirms the above findings CTA HEAD FINDINGS Anterior circulation: The intracranial internal carotid arteries are patent without significant stenosis. Mild calcified plaque within these vessels. There is abrupt occlusion of the distal M1 right middle cerebral artery. There is non opacification of the adjacent proximal right M2 branch vessels. There is poor opacification of distal M2 and more distal right MCA branch vessels. The M1 left middle cerebral artery is patent without significant stenosis. No left M2 proximal branch occlusion is identified. The anterior cerebral  arteries are patent without high-grade proximal stenosis. No intracranial aneurysm is identified. Posterior circulation: The intracranial vertebral arteries are patent without significant stenosis, as is the basilar artery. The posterior cerebral arteries are patent proximally without significant stenosis. Posterior communicating arteries are present bilaterally. Venous sinuses: Within limitations of contrast timing, no convincing thrombus. Anatomic variants: As described Review of the MIP images confirms the above findings CT Brain Perfusion Findings: ASPECTS: 6 CBF (<30%) Volume: 48m within the right MCA vascular territory Perfusion (Tmax>6.0s) volume: 1038mMismatch Volume: 97103mnfarction Location:Right MCA vascular territory These results were communicated to Dr. LinCheral Marker 6:06 pmon 06/13/2021by text page via the [facility]aging  system. IMPRESSION: CTA neck: 1. The common carotid, internal carotid and vertebral arteries are patent within the neck without hemodynamically significant stenosis. Mild atherosclerotic plaque within the carotid bifurcations bilaterally. 2. The left internal carotid artery appears to originate distal to the left subclavian takeoff. 3. 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 4. Multifocal sclerotic lesions within the cervical and visualized thoracic spine suspicious for osseous metastatic disease. 5. Partially imaged pleural effusions (greater on the right). CTA head: Abrupt occlusion of the distal M1 right middle cerebral artery. There is non opacification of adjacent proximal M2 right MCA branch vessels. There is poor opacification of more distal right MCA branch vessels. CT perfusion head: The perfusion software detects a 10 mL region of core infarction within the right MCA vascular territory, which is likely underestimated given findings on noncontrast head CT performed earlier the same day. The perfusion software detects a 107 mL region of hypoperfusion within the right MCA vascular territory utilizing a Tmax>6 seconds threshold. Reported mismatch volume: 97 mL. Electronically Signed   By: KylKellie Simmering   On: 07/07/2019 18:16   DG CHEST PORT 1 VIEW  Result Date: 07/02/2019 CLINICAL DATA:  Screening for metal prior to MRI EXAM: PORTABLE CHEST 1 VIEW COMPARISON:  11/28/2017 chest radiograph. FINDINGS: Endotracheal tube tip is 4.7 cm above the carina. Several extrinsic EKG leads overlie the chest bilaterally. Otherwise no radiopaque foreign bodies. Stable cardiomediastinal silhouette with mild cardiomegaly. No pneumothorax. Small bilateral pleural effusions. Mild-to-moderate pulmonary edema. IMPRESSION: 1. Endotracheal tube tip is 4.7 cm above the carina. Extrinsic EKG leads overlie the chest bilaterally. Otherwise no radiopaque  foreign bodies. 2. Mild-to-moderate congestive heart failure with small bilateral pleural effusions. Electronically Signed   By: JasIlona SorrelD.   On: 07/09/2019 20:52   DG Abd Portable 1V  Result Date: 07/13/2019 CLINICAL DATA:  Screening for metal prior to MRI EXAM: PORTABLE ABDOMEN - 1 VIEW COMPARISON:  10/30/2015 CT abdomen/pelvis FINDINGS: Excreted contrast fills the urinary bladder and renal collecting systems. Extrinsic EKG leads overlie the lower chest and upper abdomen. Metallic device overlies the lateral lower right ribs, presumably extrinsic. Catheter overlies the right pelvis. Otherwise no radiopaque foreign bodies. No dilated small bowel loops. No evidence of pneumatosis or pneumoperitoneum. IMPRESSION: Catheter overlies the right pelvis. EKG leads overlie the lower chest and upper abdomen. Metallic device overlies the lateral lower right ribs, presumably extrinsic. Otherwise no radiopaque foreign bodies. Excreted contrast fills the urinary bladder and renal collecting systems. Nonobstructive bowel gas pattern. Electronically Signed   By: JasIlona SorrelD.   On: 06/26/2019 20:54   CT HEAD CODE STROKE WO CONTRAST  Result Date: 07/09/2019 CLINICAL DATA:  Code stroke. Ataxia, stroke suspected. Additional history provided: Aphasia. EXAM: CT HEAD WITHOUT CONTRAST TECHNIQUE: Contiguous axial  images were obtained from the base of the skull through the vertex without intravenous contrast. COMPARISON:  Brain MRI 01/31/2016, head CT 01/15/2016 FINDINGS: Brain: There is abnormal hypodensity consistent with acute ischemic infarction involving the right caudate and lentiform nuclei as well as right internal capsule and portions of the lateral right thalamus. There is also probable subtle loss of gray-white differentiation within the anterior right temporal lobe. There is no evidence of acute intracranial hemorrhage Mild ill-defined hypoattenuation within the cerebral white matter is nonspecific, but  consistent with chronic small vessel ischemic disease. Mild generalized parenchymal atrophy. No extra-axial fluid collection. No evidence of intracranial mass. No midline shift. Vascular: Subtle hyperdensity is questioned in the region of the distal M1 right MCA. Skull: Normal. Negative for fracture or focal lesion. Sinuses/Orbits: Visualized orbits show no acute finding. Mild paranasal sinus mucosal thickening. No significant mastoid effusion. ASPECTS ([place] Stroke Program Early CT Score) - Ganglionic level infarction (caudate, lentiform nuclei, internal capsule, insula, M1-M3 cortex): 3 - Supraganglionic infarction (M4-M6 cortex): 3 Total score (0-10 with 10 being normal): 6 IMPRESSION: Acute ischemic infarction changes involving the right basal ganglia, internal capsule and anterior right temporal lobe. ASPECTS is 6. Subtle hyperdensity is questioned in the region of the distal M1 right MCA, which may reflect thrombus. Correlate with pending CTA head/neck No evidence of acute intracranial hemorrhage. Background mild generalized parenchymal atrophy and chronic small vessel ischemic disease. Electronically Signed   By: Kellie Simmering DO   On: 07/09/2019 17:50   CT ANGIO HEAD CODE STROKE  Result Date: 07/08/2019 CLINICAL DATA:  Neuro deficit, acute, stroke suspected. EXAM: CT ANGIOGRAPHY HEAD AND NECK CT PERFUSION BRAIN TECHNIQUE: Multidetector CT imaging of the head and neck was performed using the standard protocol during bolus administration of intravenous contrast. Multiplanar CT image reconstructions and MIPs were obtained to evaluate the vascular anatomy. Carotid stenosis measurements (when applicable) are obtained utilizing NASCET criteria, using the distal internal carotid diameter as the denominator. Multiphase CT imaging of the brain was performed following IV bolus contrast injection. Subsequent parametric perfusion maps were calculated using RAPID software. CONTRAST:  100 mL Omnipaque 350 intravenous  contrast COMPARISON:  Noncontrast head CT performed earlier the same day 07/10/2019 FINDINGS: CTA NECK FINDINGS Aortic arch: The left internal carotid artery appears to originate beyond the origin of the left subclavian takeoff. Atherosclerotic calcification within the visualized aortic arch and proximal major branch vessels of the neck. No hemodynamically significant innominate or proximal subclavian artery stenosis. Right carotid system: CCA and ICA patent within the neck without significant stenosis (50% or greater). Mild atherosclerotic plaque within the carotid bifurcation. Left carotid system: CCA and ICA patent within the neck without significant stenosis (50% or greater). Mild atherosclerotic plaque within the carotid bifurcation. Vertebral arteries: Codominant and patent within the neck without significant stenosis. 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 Skeleton: Numerous sclerotic lesions within the cervical and visualized thoracic spine suspicious for osseous metastatic disease. Cervical spondylosis. Other neck: No neck mass or cervical lymphadenopathy. Upper chest: Partially imaged bilateral pleural effusions (greater on the right with associated compressive atelectasis. Review of the MIP images confirms the above findings CTA HEAD FINDINGS Anterior circulation: The intracranial internal carotid arteries are patent without significant stenosis. Mild calcified plaque within these vessels. There is abrupt occlusion of the distal M1 right middle cerebral artery. There is non opacification of the adjacent proximal right  M2 branch vessels. There is poor opacification of distal M2 and more distal right MCA branch vessels. The M1 left middle cerebral artery is patent without significant stenosis. No left M2 proximal branch occlusion is identified. The anterior cerebral arteries are patent without  high-grade proximal stenosis. No intracranial aneurysm is identified. Posterior circulation: The intracranial vertebral arteries are patent without significant stenosis, as is the basilar artery. The posterior cerebral arteries are patent proximally without significant stenosis. Posterior communicating arteries are present bilaterally. Venous sinuses: Within limitations of contrast timing, no convincing thrombus. Anatomic variants: As described Review of the MIP images confirms the above findings CT Brain Perfusion Findings: ASPECTS: 6 CBF (<30%) Volume: 74m within the right MCA vascular territory Perfusion (Tmax>6.0s) volume: 1023mMismatch Volume: 9724mnfarction Location:Right MCA vascular territory These results were communicated to Dr. LinCheral Marker 6:06 pmon 06/14/2021by text page via the AM[facility]aging system. IMPRESSION: CTA neck: 1. The common carotid, internal carotid and vertebral arteries are patent within the neck without hemodynamically significant stenosis. Mild atherosclerotic plaque within the carotid bifurcations bilaterally. 2. The left internal carotid artery appears to originate distal to the left subclavian takeoff. 3. 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 4. Multifocal sclerotic lesions within the cervical and visualized thoracic spine suspicious for osseous metastatic disease. 5. Partially imaged pleural effusions (greater on the right). CTA head: Abrupt occlusion of the distal M1 right middle cerebral artery. There is non opacification of adjacent proximal M2 right MCA branch vessels. There is poor opacification of more distal right MCA branch vessels. CT perfusion head: The perfusion software detects a 10 mL region of core infarction within the right MCA vascular territory, which is likely underestimated given findings on noncontrast head CT performed earlier the same day. The perfusion software detects a 107 mL  region of hypoperfusion within the right MCA vascular territory utilizing a Tmax>6 seconds threshold. Reported mismatch volume: 97 mL. Electronically Signed   By: KylKellie Simmering   On: 06/28/2019 18:16   CT ANGIO NECK CODE STROKE  Result Date: 07/10/2019 CLINICAL DATA:  Neuro deficit, acute, stroke suspected. EXAM: CT ANGIOGRAPHY HEAD AND NECK CT PERFUSION BRAIN TECHNIQUE: Multidetector CT imaging of the head and neck was performed using the standard protocol during bolus administration of intravenous contrast. Multiplanar CT image reconstructions and MIPs were obtained to evaluate the vascular anatomy. Carotid stenosis measurements (when applicable) are obtained utilizing NASCET criteria, using the distal internal carotid diameter as the denominator. Multiphase CT imaging of the brain was performed following IV bolus contrast injection. Subsequent parametric perfusion maps were calculated using RAPID software. CONTRAST:  100 mL Omnipaque 350 intravenous contrast COMPARISON:  Noncontrast head CT performed earlier the same day 06/20/2019 FINDINGS: CTA NECK FINDINGS Aortic arch: The left internal carotid artery appears to originate beyond the origin of the left subclavian takeoff. Atherosclerotic calcification within the visualized aortic arch and proximal major branch vessels of the neck. No hemodynamically significant innominate or proximal subclavian artery stenosis. Right carotid system: CCA and ICA patent within the neck without significant stenosis (50% or greater). Mild atherosclerotic plaque within the carotid bifurcation. Left carotid system: CCA and ICA patent within the neck without significant stenosis (50% or greater). Mild atherosclerotic plaque within the carotid bifurcation. Vertebral arteries: Codominant and patent within the neck without significant stenosis. There is a probable partially calcified 2 mm aneurysm arising from the distal cervical right vertebral artery at the C1 level (series 6,  image 217). Skeleton: Numerous sclerotic lesions within the cervical and visualized thoracic spine suspicious for osseous metastatic disease. Cervical spondylosis. Other neck: No neck mass or cervical lymphadenopathy. Upper chest: Partially imaged bilateral pleural effusions (greater on the right with associated compressive atelectasis. Review of the MIP images confirms the above findings CTA HEAD FINDINGS Anterior circulation: The intracranial internal carotid arteries are patent without significant stenosis. Mild calcified plaque within these vessels. There is abrupt occlusion of the distal M1 right middle cerebral artery. There is non opacification of the adjacent proximal right M2 branch vessels. There is poor opacification of distal M2 and more distal right MCA branch vessels. The M1 left middle cerebral artery is patent without significant stenosis. No left M2 proximal branch occlusion is identified. The anterior cerebral arteries are patent without high-grade proximal stenosis. No intracranial aneurysm is identified. Posterior circulation: The intracranial vertebral arteries are patent without significant stenosis, as is the basilar artery. The posterior cerebral arteries are patent proximally without significant stenosis. Posterior communicating arteries are present bilaterally. Venous sinuses: Within limitations of contrast timing, no convincing thrombus. Anatomic variants: As described Review of the MIP images confirms the above findings CT Brain Perfusion Findings: ASPECTS: 6 CBF (<30%) Volume: 65m within the right MCA vascular territory Perfusion (Tmax>6.0s) volume: 1026mMismatch Volume: 9740mnfarction Location:Right MCA vascular territory These results were communicated to Dr. LinCheral Marker 6:06 pmon 06/10/2021by text page via the AMI[facility] LLCssaging system. IMPRESSION: CTA neck: 1. The common carotid, internal carotid and vertebral arteries are patent within the neck without hemodynamically significant  stenosis. Mild atherosclerotic plaque within the carotid bifurcations bilaterally. 2. The left internal carotid artery appears to originate distal to the left subclavian takeoff. 3. 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 4. Multifocal sclerotic lesions within the cervical and visualized thoracic spine suspicious for osseous metastatic disease. 5. Partially imaged pleural effusions (greater on the right). CTA head: Abrupt occlusion of the distal M1 right middle cerebral artery. There is non opacification of adjacent proximal M2 right MCA branch vessels. There is poor opacification of more distal right MCA branch vessels. CT perfusion head: The perfusion software detects a 10 mL region of core infarction within the right MCA vascular territory, which is likely underestimated given findings on noncontrast head CT performed earlier the same day. The perfusion software detects a 107 mL region of hypoperfusion within the right MCA vascular territory utilizing a Tmax>6 seconds threshold. Reported mismatch volume: 97 mL. Electronically Signed   By: KylKellie Simmering   On: 07/13/2019 18:16    Procedures Procedures (including critical care time)  Medications Ordered in ED Medications  ondansetron (ZOFRAN) 4 MG/2ML injection (has no administration in time range)  iohexol (OMNIPAQUE) 240 MG/ML injection (has no administration in time range)   stroke: mapping our early stages of recovery book (has no administration in time range)  0.9 %  sodium chloride infusion ( Intravenous Rate/Dose Verify 06/24/2019 2100)  acetaminophen (TYLENOL) tablet 650 mg (has no administration in time range)    Or  acetaminophen (TYLENOL) 160 MG/5ML solution 650 mg (has no administration in time range)    Or  acetaminophen (TYLENOL) suppository 650 mg (has no administration in time range)  senna-docusate (Senokot-S) tablet 1 tablet (has no administration in time  range)  clevidipine (CLEVIPREX) infusion 0.5 mg/mL (4 mg/hr Intravenous Rate/Dose Verify 07/07/2019 2100)  brimonidine (ALPHAGAN) 0.2 % ophthalmic solution 1 drop (has no administration in time range)  timolol (TIMOPTIC) 0.5 % ophthalmic solution 1 drop (has no administration in time range)  diltiazem (CARDIZEM CD) 24 hr capsule 240 mg (has no administration in time range)  levothyroxine (SYNTHROID) tablet 37.5 mcg (has no administration in time range)  latanoprost (XALATAN) 0.005 % ophthalmic solution 1 drop (has no administration in time range)  metoprolol succinate (TOPROL-XL) 24 hr tablet 25 mg (has no administration in time range)  Chlorhexidine Gluconate Cloth 2 % PADS 6 each (has no administration in time range)  chlorhexidine gluconate (MEDLINE KIT) (PERIDEX) 0.12 % solution 15 mL (has no administration in time range)  MEDLINE mouth rinse (has no administration in time range)  pantoprazole (PROTONIX) injection 40 mg (has no administration in time range)  iohexol (OMNIPAQUE) 350 MG/ML injection 100 mL (100 mLs Intravenous Contrast Given 07/06/2019 1750)  iohexol (OMNIPAQUE) 300 MG/ML solution 50 mL (50 mLs Intra-arterial Contrast Given 06/25/2019 1920)  iohexol (OMNIPAQUE) 240 MG/ML injection 150 mL (10 mLs Intra-arterial Contrast Given 06/23/2019 1920)    ED Course  I have reviewed the triage vital signs and the nursing notes.  Pertinent labs & imaging results that were available during my care of the patient were reviewed by me and considered in my medical decision making (see chart for details).    MDM Rules/Calculators/A&P                      86 yo F arrived as a code stroke with concern for LVO.  Right-sided gaze preference with left-sided paralysis.  Taken emergently to CT.  Found to have an LVO taken emergently to IR.  CRITICAL CARE Performed by: Cecilio Asper   Total critical care time: 35 minutes  Critical care time was exclusive of separately billable procedures and  treating other patients.  Critical care was necessary to treat or prevent imminent or life-threatening deterioration.  Critical care was time spent personally by me on the following activities: development of treatment plan with patient and/or surrogate as well as nursing, discussions with consultants, evaluation of patient's response to treatment, examination of patient, obtaining history from patient or surrogate, ordering and performing treatments and interventions, ordering and review of laboratory studies, ordering and review of radiographic studies, pulse oximetry and re-evaluation of patient's condition.  The patients results and plan were reviewed and discussed.   Any x-rays performed were independently reviewed by myself.   Differential diagnosis were considered with the presenting HPI.  Medications  ondansetron (ZOFRAN) 4 MG/2ML injection (has no administration in time range)  iohexol (OMNIPAQUE) 240 MG/ML injection (has no administration in time range)   stroke: mapping our early stages of recovery book (has no administration in time range)  0.9 %  sodium chloride infusion ( Intravenous Rate/Dose Verify 07/06/2019 2100)  acetaminophen (TYLENOL) tablet 650 mg (has no administration in time range)    Or  acetaminophen (TYLENOL) 160 MG/5ML solution 650 mg (has no administration in time range)    Or  acetaminophen (TYLENOL) suppository 650 mg (has no administration in time range)  senna-docusate (Senokot-S) tablet 1 tablet (has no administration in time range)  clevidipine (CLEVIPREX) infusion 0.5 mg/mL (4 mg/hr Intravenous Rate/Dose Verify 06/25/2019 2100)  brimonidine (ALPHAGAN) 0.2 % ophthalmic solution 1 drop (has no administration in time range)  timolol (TIMOPTIC) 0.5 % ophthalmic solution 1 drop (has no administration in time range)  diltiazem (CARDIZEM CD) 24 hr capsule 240 mg (has no administration in time range)  levothyroxine (SYNTHROID) tablet 37.5 mcg (has no administration in  time range)  latanoprost (XALATAN) 0.005 % ophthalmic solution 1 drop (has no administration in time range)  metoprolol succinate (TOPROL-XL) 24 hr tablet 25 mg (has no administration in time range)  Chlorhexidine Gluconate Cloth 2 % PADS 6 each (has no administration in time range)  chlorhexidine gluconate (MEDLINE KIT) (PERIDEX) 0.12 % solution 15 mL (has no administration in time range)  MEDLINE mouth rinse (has no administration in time range)  pantoprazole (PROTONIX) injection 40 mg (has no administration in time range)  iohexol (OMNIPAQUE) 350 MG/ML injection 100 mL (100 mLs Intravenous Contrast Given 07/07/2019 1750)  iohexol (OMNIPAQUE) 300 MG/ML solution 50 mL (50 mLs Intra-arterial Contrast Given 06/27/2019 1920)  iohexol (OMNIPAQUE) 240 MG/ML injection 150 mL (10 mLs Intra-arterial Contrast Given 06/18/2019 1920)    Vitals:   06/20/2019 2015 06/19/2019 2030 07/02/2019 2045 06/18/2019 2100  BP: (!) 130/115 (!) 109/58 121/87 111/77  Pulse: (!) 104 90 98 87  Resp: 20 (!) _0 SpO2: 100% 100% 100% 100%    Final diagnoses:  Stroke (cerebrum) ([place])    Admission/ observation were discussed with the admitting physician, patient and/or family and they are comfortable with the plan.   Final Clinical Impression(s) / ED Diagnoses Final diagnoses:  Stroke (cerebrum) [facility])    Rx / DC Orders ED Discharge Orders    None       Deno Etienne, DO 07/04/2019 2105

## 2019-06-16 NOTE — Transfer of Care (Signed)
Immediate Anesthesia Transfer of Care Note  Patient: Danielle Malone  Procedure(s) Performed: IR WITH ANESTHESIA (N/A )  Patient Location: ICU  Anesthesia Type:General  Level of Consciousness: sedated and Patient remains intubated per anesthesia plan  Airway & Oxygen Therapy: Patient remains intubated per anesthesia plan and Patient placed on Ventilator (see vital sign flow sheet for setting)  Covid status not back from lab yet   Post-op Assessment: Report given to RN and Post -op Vital signs reviewed and stable  Post vital signs: Reviewed and stable  Last Vitals:  Vitals Value Taken Time  BP    Temp    Pulse 98 06/21/2019 1939  Resp 16 07/13/2019 1939  SpO2 99 % 07/15/2019 1939  Vitals shown include unvalidated device data.  Last Pain:  Vitals:   06/28/2019 1836  PainSc: 0-No pain         Complications: No apparent anesthesia complications

## 2019-06-17 ENCOUNTER — Inpatient Hospital Stay (HOSPITAL_COMMUNITY): Payer: Medicare Other

## 2019-06-17 ENCOUNTER — Encounter: Payer: Self-pay | Admitting: *Deleted

## 2019-06-17 ENCOUNTER — Other Ambulatory Visit: Payer: Self-pay

## 2019-06-17 DIAGNOSIS — E43 Unspecified severe protein-calorie malnutrition: Secondary | ICD-10-CM | POA: Diagnosis present

## 2019-06-17 LAB — GLUCOSE, CAPILLARY
Glucose-Capillary: 105 mg/dL — ABNORMAL HIGH (ref 70–99)
Glucose-Capillary: 113 mg/dL — ABNORMAL HIGH (ref 70–99)
Glucose-Capillary: 122 mg/dL — ABNORMAL HIGH (ref 70–99)
Glucose-Capillary: 122 mg/dL — ABNORMAL HIGH (ref 70–99)
Glucose-Capillary: 124 mg/dL — ABNORMAL HIGH (ref 70–99)
Glucose-Capillary: 126 mg/dL — ABNORMAL HIGH (ref 70–99)
Glucose-Capillary: 139 mg/dL — ABNORMAL HIGH (ref 70–99)
Glucose-Capillary: 154 mg/dL — ABNORMAL HIGH (ref 70–99)

## 2019-06-17 LAB — POCT I-STAT 7, (LYTES, BLD GAS, ICA,H+H)
Acid-base deficit: 1 mmol/L (ref 0.0–2.0)
Bicarbonate: 21.9 mmol/L (ref 20.0–28.0)
Calcium, Ion: 1.08 mmol/L — ABNORMAL LOW (ref 1.15–1.40)
HCT: 38 % (ref 36.0–46.0)
Hemoglobin: 12.9 g/dL (ref 12.0–15.0)
O2 Saturation: 96 %
Patient temperature: 97.7
Potassium: 3.7 mmol/L (ref 3.5–5.1)
Sodium: 134 mmol/L — ABNORMAL LOW (ref 135–145)
TCO2: 23 mmol/L (ref 22–32)
pCO2 arterial: 30.7 mmHg — ABNORMAL LOW (ref 32.0–48.0)
pH, Arterial: 7.458 — ABNORMAL HIGH (ref 7.350–7.450)
pO2, Arterial: 75 mmHg — ABNORMAL LOW (ref 83.0–108.0)

## 2019-06-17 LAB — POCT I-STAT, CHEM 8
BUN: 16 mg/dL (ref 8–23)
Calcium, Ion: 1.04 mmol/L — ABNORMAL LOW (ref 1.15–1.40)
Chloride: 95 mmol/L — ABNORMAL LOW (ref 98–111)
Creatinine, Ser: 0.4 mg/dL — ABNORMAL LOW (ref 0.44–1.00)
Glucose, Bld: 141 mg/dL — ABNORMAL HIGH (ref 70–99)
HCT: 48 % — ABNORMAL HIGH (ref 36.0–46.0)
Hemoglobin: 16.3 g/dL — ABNORMAL HIGH (ref 12.0–15.0)
Potassium: 4 mmol/L (ref 3.5–5.1)
Sodium: 131 mmol/L — ABNORMAL LOW (ref 135–145)
TCO2: 22 mmol/L (ref 22–32)

## 2019-06-17 LAB — LIPID PANEL
Cholesterol: 174 mg/dL (ref 0–200)
HDL: 73 mg/dL (ref >40)
LDL Cholesterol: 87 mg/dL (ref 0–99)
Total CHOL/HDL Ratio: 2.4 RATIO
Triglycerides: 68 mg/dL (ref <150)
VLDL: 14 mg/dL (ref 0–40)

## 2019-06-17 LAB — HEMOGLOBIN A1C
Hgb A1c MFr Bld: 6.6 % — ABNORMAL HIGH (ref 4.8–5.6)
Mean Plasma Glucose: 142.72 mg/dL

## 2019-06-17 LAB — MAGNESIUM
Magnesium: 1.7 mg/dL (ref 1.7–2.4)
Magnesium: 1.7 mg/dL (ref 1.7–2.4)

## 2019-06-17 LAB — PHOSPHORUS
Phosphorus: 3.5 mg/dL (ref 2.5–4.6)
Phosphorus: 3.3 mg/dL (ref 2.5–4.6)

## 2019-06-17 LAB — LACTIC ACID, PLASMA: Lactic Acid, Venous: 1.4 mmol/L (ref 0.5–1.9)

## 2019-06-17 LAB — TRIGLYCERIDES: Triglycerides: 52 mg/dL (ref <150)

## 2019-06-17 MED ORDER — ATORVASTATIN CALCIUM 10 MG PO TABS: 20.0000 mg | ORAL_TABLET | Freq: Every day | ORAL | Status: DC

## 2019-06-17 MED ORDER — SODIUM CHLORIDE 0.9 % IV BOLUS
500.0000 mL | Freq: Once | INTRAVENOUS | Status: AC
  Administered 2019-06-17: 500 mL via INTRAVENOUS

## 2019-06-17 MED ORDER — VITAL AF 1.2 CAL PO LIQD
1000.0000 mL | ORAL | Status: DC
  Administered 2019-06-17 – 2019-06-21 (×3): 1000 mL

## 2019-06-17 MED ORDER — METOPROLOL TARTRATE 25 MG PO TABS
25.0000 mg | ORAL_TABLET | Freq: Two times a day (BID) | ORAL | Status: DC
  Administered 2019-06-17 (×2): 25 mg
  Filled 2019-06-17 (×3): qty 1

## 2019-06-17 MED ORDER — ATORVASTATIN CALCIUM 10 MG PO TABS
20.0000 mg | ORAL_TABLET | Freq: Every day | ORAL | Status: DC
  Filled 2019-06-17: qty 2

## 2019-06-17 MED ORDER — ATORVASTATIN CALCIUM 10 MG PO TABS
20.0000 mg | ORAL_TABLET | Freq: Every day | ORAL | Status: DC
  Administered 2019-06-17 – 2019-06-21 (×5): 20 mg
  Filled 2019-06-17 (×4): qty 2

## 2019-06-17 MED ORDER — DILTIAZEM HCL 60 MG PO TABS
60.0000 mg | ORAL_TABLET | Freq: Four times a day (QID) | ORAL | Status: DC
  Administered 2019-06-17 – 2019-06-18 (×5): 60 mg
  Filled 2019-06-17 (×6): qty 1

## 2019-06-17 MED ORDER — LEVOTHYROXINE SODIUM 75 MCG PO TABS
37.5000 ug | ORAL_TABLET | Freq: Every day | ORAL | Status: DC
  Administered 2019-06-18 – 2019-06-21 (×4): 37.5 ug
  Filled 2019-06-17 (×4): qty 1

## 2019-06-17 MED ORDER — SENNOSIDES-DOCUSATE SODIUM 8.6-50 MG PO TABS: 1.0000 | ORAL_TABLET | Freq: Every evening | ORAL | Status: DC | PRN

## 2019-06-17 NOTE — Procedures (Signed)
Cortrak  Person Inserting Tube:  Maylon Peppers C, RD Tube Type:  Cortrak - 43 inches Tube Location:  Left nare Initial Placement:  Stomach Secured by: Bridle Technique Used to Measure Tube Placement:  Documented cm marking at nare/ corner of mouth Cortrak Secured At:  68 cm    Cortrak Tube Team Note:  Consult received to place a Cortrak feeding tube.   No x-ray is required. RN may begin using tube.   If the tube becomes dislodged please keep the tube and contact the Cortrak team at [email or web address] (password [email or web address]) for replacement.  If after hours and replacement cannot be delayed, place a NG tube and confirm placement with an abdominal x-ray.    Lockie Pares., RD, LDN, CNSC See AMiON for contact information

## 2019-06-17 NOTE — Progress Notes (Signed)
Patient transported to CT and back to room 4N28 without any apparent complications.

## 2019-06-17 NOTE — Progress Notes (Signed)
STROKE TEAM PROGRESS NOTE   INTERVAL HISTORY Her RN is at the bedside.  Pt still intubated on sedation. Barely open eyes on pain stimulation, not able to following commands. However, as per RN, pt was able to wiggle right toes on command earlier with sedation off. Pt still has left hemiplegia. CT and MRI overnight showed HT at right BG. CT repeat pending. BP goal < 140 so far.   Vitals:   06/17/19 0830 06/17/19 0845 06/17/19 0900 06/17/19 0915  BP: (!) 136/93 139/86 140/76 131/78  Pulse: (!) 107 (!) 104 (!) 102 (!) 133  Resp: 17 16 17 16   Temp:      TempSrc:      SpO2: 98% 98% 99% 98%  Weight:       CBC:  Recent Labs  Lab 07/03/2019 1724  WBC 8.1  NEUTROABS 7.0  HGB 14.9  HCT 45.6  MCV 88.5  PLT [id number]   Basic Metabolic Panel:  Recent Labs  Lab 07/14/2019 1724  NA 132*  K 4.2  CL 96*  CO2 21*  GLUCOSE 143*  BUN 14  CREATININE 0.78  CALCIUM 8.9   Lipid Panel:     Component Value Date/Time   CHOL 174 06/17/2019 0126   TRIG 68 06/17/2019 0126   TRIG 52 06/17/2019 0126   HDL 73 06/17/2019 0126   CHOLHDL 2.4 06/17/2019 0126   VLDL 14 06/17/2019 0126   LDLCALC 87 06/17/2019 0126   HgbA1c:  Lab Results  Component Value Date   HGBA1C 6.6 (H) 06/17/2019   Urine Drug Screen:     Component Value Date/Time   LABOPIA NONE DETECTED 07/07/2019 1955   COCAINSCRNUR NONE DETECTED 07/13/2019 1955   LABBENZ NONE DETECTED 06/23/2019 1955   AMPHETMU NONE DETECTED 07/08/2019 1955   THCU NONE DETECTED 06/25/2019 1955   LABBARB NONE DETECTED 06/17/2019 1955    Alcohol Level     Component Value Date/Time   ETH <10 06/24/2019 1724    IMAGING past 24 hours CT HEAD WO CONTRAST  Result Date: 06/27/2019 CLINICAL DATA:  Follow-up examination for acute stroke. EXAM: CT HEAD WITHOUT CONTRAST TECHNIQUE: Contiguous axial images were obtained from the base of the skull through the vertex without intravenous contrast. COMPARISON:  Prior brain MRI and CTs from earlier the same day.  FINDINGS: Brain: Evolving right MCA territory infarct involving the right basal ganglia with involvement of the right caudate and lentiform nuclei as well as the anterior right internal capsule again seen. Parenchymal hyperdensity involving the right lentiform nucleus measures 5.3 x 2.2 x 3.2 cm. While this at least in part likely reflects contrast staining related to recent arteriogram, appearance most concerning for hemorrhage given findings on prior MRI. Mild localized edema without significant regional mass effect at this time. No other evidence for acute intracranial hemorrhage. No other acute large vessel territory infarct. No mass lesion or extra-axial fluid collection. No hydrocephalus. Vascular: Contrast material seen diffusely throughout the intracranial circulation. Calcified atherosclerosis present at the skull base. Skull: Scalp soft tissues and calvarium within normal limits. Sinuses/Orbits: Globes and orbital soft tissues within normal limits. Mild scattered mucosal thickening noted within the ethmoidal air cells and sphenoid sinuses. Mastoid air cells are clear. Endotracheal tube partially visualized. Other: None. IMPRESSION: 1. Evolving right basal ganglia infarct, stable in size and distribution as compared to recent MRI. Superimposed 5.3 x 2.2 x 3.2 cm parenchymal hyperdensity involving the right lentiform nucleus most concerning for hemorrhagic transformation given findings on prior MRI. Mild localized edema  without significant regional mass effect at this time. 2. No other new acute intracranial abnormality. These results were communicated to Dr. Rory Percy at 11:43 pmon 06/12/2021by text page via the [facility] messaging system. Electronically Signed   By: Jeannine Boga M.D.   On: 07/07/2019 23:44   MR BRAIN WO CONTRAST  Result Date: 07/08/2019 CLINICAL DATA:  Initial evaluation for neuro deficit, stroke suspected. EXAM: MRI HEAD WITHOUT CONTRAST TECHNIQUE: Multiplanar, multiecho pulse sequences  of the brain and surrounding structures were obtained without intravenous contrast. COMPARISON:  Comparison made with prior CTs from earlier the same day. FINDINGS: Brain: Cerebral volume within normal limits for age. Mild chronic microvascular ischemic disease present within the periventricular white matter and pons. Associated small remote lacunar infarcts noted about the thalami. Evolving acute ischemic right MCA territory infarct seen involving the right basal ganglia, with involvement of the right caudate and lentiform nuclei as well as the anterior limb of the right internal capsule. Associated susceptibility artifact consistent with blood products. Suggestion of possible hemorrhagic transformation with intraparenchymal hemorrhage at the level of the right lentiform nucleus. Mild localized edema with partial effacement of the adjacent right lateral ventricle with trace localized right-to-left midline shift. No hydrocephalus or trapping. No other evidence for acute or subacute ischemia seen elsewhere within the brain. Gray-white matter differentiation otherwise maintained. No mass lesion or mass effect elsewhere within the brain. No extra-axial fluid collection. Pituitary gland suprasellar region normal. Midline structures intact. Vascular: Slight asymmetry within the intravascular flow void at the right carotid siphon as compared to the left, suspected to be related to slow flow (series 10, image 7). Major intracranial vascular flow voids otherwise maintained. Skull and upper cervical spine: Craniocervical junction within normal limits. Bone marrow signal intensity normal. No scalp soft tissue abnormality. Sinuses/Orbits: Patient status post bilateral ocular lens replacement. Scattered mucosal thickening noted within the sphenoid ethmoidal sinuses. Paranasal sinuses are otherwise largely clear. No significant mastoid effusion. Inner ear structures grossly normal. Fluid noted layering within the nasopharynx.  Other: None. IMPRESSION: 1. Evolving acute ischemic right MCA territory infarct involving the right basal ganglia, with suggestion of possible hemorrhagic transformation at the right lentiform nucleus. Correlation with follow-up head CT suggested for further evaluation. 2. Slight asymmetry within the intravascular flow void at the right carotid siphon as compared to the left, suspected to be related to slow flow. If there is clinical concern for possible reocclusion, further assessment with dedicated CTA and/or MRA could be performed for further evaluation as warranted. 3. Underlying age-related cerebral atrophy with mild chronic small vessel ischemic disease. Critical Value/emergent results were called by telephone at the time of interpretation on 06/23/2019 at 10:59 pm to provider Dr. Rory Percy, who verbally acknowledged these results. Electronically Signed   By: Jeannine Boga M.D.   On: 07/02/2019 23:01   CT CEREBRAL PERFUSION W CONTRAST  Result Date: 06/16/2019 CLINICAL DATA:  Neuro deficit, acute, stroke suspected. EXAM: CT ANGIOGRAPHY HEAD AND NECK CT PERFUSION BRAIN TECHNIQUE: Multidetector CT imaging of the head and neck was performed using the standard protocol during bolus administration of intravenous contrast. Multiplanar CT image reconstructions and MIPs were obtained to evaluate the vascular anatomy. Carotid stenosis measurements (when applicable) are obtained utilizing NASCET criteria, using the distal internal carotid diameter as the denominator. Multiphase CT imaging of the brain was performed following IV bolus contrast injection. Subsequent parametric perfusion maps were calculated using RAPID software. CONTRAST:  100 mL Omnipaque 350 intravenous contrast COMPARISON:  Noncontrast head CT performed earlier the  same day 07/15/2019 FINDINGS: CTA NECK FINDINGS Aortic arch: The left internal carotid artery appears to originate beyond the origin of the left subclavian takeoff. Atherosclerotic  calcification within the visualized aortic arch and proximal major branch vessels of the neck. No hemodynamically significant innominate or proximal subclavian artery stenosis. Right carotid system: CCA and ICA patent within the neck without significant stenosis (50% or greater). Mild atherosclerotic plaque within the carotid bifurcation. Left carotid system: CCA and ICA patent within the neck without significant stenosis (50% or greater). Mild atherosclerotic plaque within the carotid bifurcation. Vertebral arteries: Codominant and patent within the neck without significant stenosis. 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 Skeleton: Numerous sclerotic lesions within the cervical and visualized thoracic spine suspicious for osseous metastatic disease. Cervical spondylosis. Other neck: No neck mass or cervical lymphadenopathy. Upper chest: Partially imaged bilateral pleural effusions (greater on the right with associated compressive atelectasis. Review of the MIP images confirms the above findings CTA HEAD FINDINGS Anterior circulation: The intracranial internal carotid arteries are patent without significant stenosis. Mild calcified plaque within these vessels. There is abrupt occlusion of the distal M1 right middle cerebral artery. There is non opacification of the adjacent proximal right M2 branch vessels. There is poor opacification of distal M2 and more distal right MCA branch vessels. The M1 left middle cerebral artery is patent without significant stenosis. No left M2 proximal branch occlusion is identified. The anterior cerebral arteries are patent without high-grade proximal stenosis. No intracranial aneurysm is identified. Posterior circulation: The intracranial vertebral arteries are patent without significant stenosis, as is the basilar artery. The posterior cerebral arteries are patent proximally  without significant stenosis. Posterior communicating arteries are present bilaterally. Venous sinuses: Within limitations of contrast timing, no convincing thrombus. Anatomic variants: As described Review of the MIP images confirms the above findings CT Brain Perfusion Findings: ASPECTS: 6 CBF (<30%) Volume: 54mL within the right MCA vascular territory Perfusion (Tmax>6.0s) volume: 139mL Mismatch Volume: 38mL Infarction Location:Right MCA vascular territory These results were communicated to Dr. Cheral Marker At 6:06 pmon 06/26/2021by text page via the [facility] messaging system. IMPRESSION: CTA neck: 1. The common carotid, internal carotid and vertebral arteries are patent within the neck without hemodynamically significant stenosis. Mild atherosclerotic plaque within the carotid bifurcations bilaterally. 2. The left internal carotid artery appears to originate distal to the left subclavian takeoff. 3. 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 4. Multifocal sclerotic lesions within the cervical and visualized thoracic spine suspicious for osseous metastatic disease. 5. Partially imaged pleural effusions (greater on the right). CTA head: Abrupt occlusion of the distal M1 right middle cerebral artery. There is non opacification of adjacent proximal M2 right MCA branch vessels. There is poor opacification of more distal right MCA branch vessels. CT perfusion head: The perfusion software detects a 10 mL region of core infarction within the right MCA vascular territory, which is likely underestimated given findings on noncontrast head CT performed earlier the same day. The perfusion software detects a 107 mL region of hypoperfusion within the right MCA vascular territory utilizing a Tmax>6 seconds threshold. Reported mismatch volume: 97 mL. Electronically Signed   By: Kellie Simmering DO   On: 07/11/2019 18:16   DG CHEST PORT 1 VIEW  Result Date:  06/16/2019 CLINICAL DATA:  Screening for metal prior to MRI EXAM: PORTABLE CHEST 1  VIEW COMPARISON:  11/28/2017 chest radiograph. FINDINGS: Endotracheal tube tip is 4.7 cm above the carina. Several extrinsic EKG leads overlie the chest bilaterally. Otherwise no radiopaque foreign bodies. Stable cardiomediastinal silhouette with mild cardiomegaly. No pneumothorax. Small bilateral pleural effusions. Mild-to-moderate pulmonary edema. IMPRESSION: 1. Endotracheal tube tip is 4.7 cm above the carina. Extrinsic EKG leads overlie the chest bilaterally. Otherwise no radiopaque foreign bodies. 2. Mild-to-moderate congestive heart failure with small bilateral pleural effusions. Electronically Signed   By: Ilona Sorrel M.D.   On: 07/03/2019 20:52   DG Abd Portable 1V  Result Date: 06/17/2019 CLINICAL DATA:  Screening for metal prior to MRI EXAM: PORTABLE ABDOMEN - 1 VIEW COMPARISON:  10/30/2015 CT abdomen/pelvis FINDINGS: Excreted contrast fills the urinary bladder and renal collecting systems. Extrinsic EKG leads overlie the lower chest and upper abdomen. Metallic device overlies the lateral lower right ribs, presumably extrinsic. Catheter overlies the right pelvis. Otherwise no radiopaque foreign bodies. No dilated small bowel loops. No evidence of pneumatosis or pneumoperitoneum. IMPRESSION: Catheter overlies the right pelvis. EKG leads overlie the lower chest and upper abdomen. Metallic device overlies the lateral lower right ribs, presumably extrinsic. Otherwise no radiopaque foreign bodies. Excreted contrast fills the urinary bladder and renal collecting systems. Nonobstructive bowel gas pattern. Electronically Signed   By: Ilona Sorrel M.D.   On: 06/21/2019 20:54   CT HEAD CODE STROKE WO CONTRAST  Result Date: 06/20/2019 CLINICAL DATA:  Code stroke. Ataxia, stroke suspected. Additional history provided: Aphasia. EXAM: CT HEAD WITHOUT CONTRAST TECHNIQUE: Contiguous axial images were obtained from the base of the  skull through the vertex without intravenous contrast. COMPARISON:  Brain MRI 01/31/2016, head CT 01/15/2016 FINDINGS: Brain: There is abnormal hypodensity consistent with acute ischemic infarction involving the right caudate and lentiform nuclei as well as right internal capsule and portions of the lateral right thalamus. There is also probable subtle loss of gray-white differentiation within the anterior right temporal lobe. There is no evidence of acute intracranial hemorrhage Mild ill-defined hypoattenuation within the cerebral white matter is nonspecific, but consistent with chronic small vessel ischemic disease. Mild generalized parenchymal atrophy. No extra-axial fluid collection. No evidence of intracranial mass. No midline shift. Vascular: Subtle hyperdensity is questioned in the region of the distal M1 right MCA. Skull: Normal. Negative for fracture or focal lesion. Sinuses/Orbits: Visualized orbits show no acute finding. Mild paranasal sinus mucosal thickening. No significant mastoid effusion. ASPECTS ([place] Stroke Program Early CT Score) - Ganglionic level infarction (caudate, lentiform nuclei, internal capsule, insula, M1-M3 cortex): 3 - Supraganglionic infarction (M4-M6 cortex): 3 Total score (0-10 with 10 being normal): 6 IMPRESSION: Acute ischemic infarction changes involving the right basal ganglia, internal capsule and anterior right temporal lobe. ASPECTS is 6. Subtle hyperdensity is questioned in the region of the distal M1 right MCA, which may reflect thrombus. Correlate with pending CTA head/neck No evidence of acute intracranial hemorrhage. Background mild generalized parenchymal atrophy and chronic small vessel ischemic disease. Electronically Signed   By: Kellie Simmering DO   On: 07/11/2019 17:50   CT ANGIO HEAD CODE STROKE  Result Date: 07/04/2019 CLINICAL DATA:  Neuro deficit, acute, stroke suspected. EXAM: CT ANGIOGRAPHY HEAD AND NECK CT PERFUSION BRAIN TECHNIQUE: Multidetector CT  imaging of the head and neck was performed using the standard protocol during bolus administration of intravenous contrast. Multiplanar CT image reconstructions and MIPs were obtained to evaluate the vascular anatomy. Carotid stenosis measurements (when applicable) are obtained utilizing NASCET criteria, using the distal internal carotid diameter as  the denominator. Multiphase CT imaging of the brain was performed following IV bolus contrast injection. Subsequent parametric perfusion maps were calculated using RAPID software. CONTRAST:  100 mL Omnipaque 350 intravenous contrast COMPARISON:  Noncontrast head CT performed earlier the same day 07/15/2019 FINDINGS: CTA NECK FINDINGS Aortic arch: The left internal carotid artery appears to originate beyond the origin of the left subclavian takeoff. Atherosclerotic calcification within the visualized aortic arch and proximal major branch vessels of the neck. No hemodynamically significant innominate or proximal subclavian artery stenosis. Right carotid system: CCA and ICA patent within the neck without significant stenosis (50% or greater). Mild atherosclerotic plaque within the carotid bifurcation. Left carotid system: CCA and ICA patent within the neck without significant stenosis (50% or greater). Mild atherosclerotic plaque within the carotid bifurcation. Vertebral arteries: Codominant and patent within the neck without significant stenosis. 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 Skeleton: Numerous sclerotic lesions within the cervical and visualized thoracic spine suspicious for osseous metastatic disease. Cervical spondylosis. Other neck: No neck mass or cervical lymphadenopathy. Upper chest: Partially imaged bilateral pleural effusions (greater on the right with associated compressive atelectasis. Review of the MIP images confirms the above findings CTA  HEAD FINDINGS Anterior circulation: The intracranial internal carotid arteries are patent without significant stenosis. Mild calcified plaque within these vessels. There is abrupt occlusion of the distal M1 right middle cerebral artery. There is non opacification of the adjacent proximal right M2 branch vessels. There is poor opacification of distal M2 and more distal right MCA branch vessels. The M1 left middle cerebral artery is patent without significant stenosis. No left M2 proximal branch occlusion is identified. The anterior cerebral arteries are patent without high-grade proximal stenosis. No intracranial aneurysm is identified. Posterior circulation: The intracranial vertebral arteries are patent without significant stenosis, as is the basilar artery. The posterior cerebral arteries are patent proximally without significant stenosis. Posterior communicating arteries are present bilaterally. Venous sinuses: Within limitations of contrast timing, no convincing thrombus. Anatomic variants: As described Review of the MIP images confirms the above findings CT Brain Perfusion Findings: ASPECTS: 6 CBF (<30%) Volume: 30mL within the right MCA vascular territory Perfusion (Tmax>6.0s) volume: 131mL Mismatch Volume: 35mL Infarction Location:Right MCA vascular territory These results were communicated to Dr. Cheral Marker At 6:06 pmon 6/1/2021by text page via the [facility] messaging system. IMPRESSION: CTA neck: 1. The common carotid, internal carotid and vertebral arteries are patent within the neck without hemodynamically significant stenosis. Mild atherosclerotic plaque within the carotid bifurcations bilaterally. 2. The left internal carotid artery appears to originate distal to the left subclavian takeoff. 3. 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 4. Multifocal sclerotic lesions within the cervical and visualized thoracic spine suspicious for  osseous metastatic disease. 5. Partially imaged pleural effusions (greater on the right). CTA head: Abrupt occlusion of the distal M1 right middle cerebral artery. There is non opacification of adjacent proximal M2 right MCA branch vessels. There is poor opacification of more distal right MCA branch vessels. CT perfusion head: The perfusion software detects a 10 mL region of core infarction within the right MCA vascular territory, which is likely underestimated given findings on noncontrast head CT performed earlier the same day. The perfusion software detects a 107 mL region of hypoperfusion within the right MCA vascular territory utilizing a Tmax>6 seconds threshold. Reported mismatch volume: 97  mL. Electronically Signed   By: Kellie Simmering DO   On: 07/13/2019 18:16   CT ANGIO NECK CODE STROKE  Result Date: 06/25/2019 CLINICAL DATA:  Neuro deficit, acute, stroke suspected. EXAM: CT ANGIOGRAPHY HEAD AND NECK CT PERFUSION BRAIN TECHNIQUE: Multidetector CT imaging of the head and neck was performed using the standard protocol during bolus administration of intravenous contrast. Multiplanar CT image reconstructions and MIPs were obtained to evaluate the vascular anatomy. Carotid stenosis measurements (when applicable) are obtained utilizing NASCET criteria, using the distal internal carotid diameter as the denominator. Multiphase CT imaging of the brain was performed following IV bolus contrast injection. Subsequent parametric perfusion maps were calculated using RAPID software. CONTRAST:  100 mL Omnipaque 350 intravenous contrast COMPARISON:  Noncontrast head CT performed earlier the same day 07/13/2019 FINDINGS: CTA NECK FINDINGS Aortic arch: The left internal carotid artery appears to originate beyond the origin of the left subclavian takeoff. Atherosclerotic calcification within the visualized aortic arch and proximal major branch vessels of the neck. No hemodynamically significant innominate or proximal  subclavian artery stenosis. Right carotid system: CCA and ICA patent within the neck without significant stenosis (50% or greater). Mild atherosclerotic plaque within the carotid bifurcation. Left carotid system: CCA and ICA patent within the neck without significant stenosis (50% or greater). Mild atherosclerotic plaque within the carotid bifurcation. Vertebral arteries: Codominant and patent within the neck without significant stenosis. 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 Skeleton: Numerous sclerotic lesions within the cervical and visualized thoracic spine suspicious for osseous metastatic disease. Cervical spondylosis. Other neck: No neck mass or cervical lymphadenopathy. Upper chest: Partially imaged bilateral pleural effusions (greater on the right with associated compressive atelectasis. Review of the MIP images confirms the above findings CTA HEAD FINDINGS Anterior circulation: The intracranial internal carotid arteries are patent without significant stenosis. Mild calcified plaque within these vessels. There is abrupt occlusion of the distal M1 right middle cerebral artery. There is non opacification of the adjacent proximal right M2 branch vessels. There is poor opacification of distal M2 and more distal right MCA branch vessels. The M1 left middle cerebral artery is patent without significant stenosis. No left M2 proximal branch occlusion is identified. The anterior cerebral arteries are patent without high-grade proximal stenosis. No intracranial aneurysm is identified. Posterior circulation: The intracranial vertebral arteries are patent without significant stenosis, as is the basilar artery. The posterior cerebral arteries are patent proximally without significant stenosis. Posterior communicating arteries are present bilaterally. Venous sinuses: Within limitations of contrast timing, no  convincing thrombus. Anatomic variants: As described Review of the MIP images confirms the above findings CT Brain Perfusion Findings: ASPECTS: 6 CBF (<30%) Volume: 74mL within the right MCA vascular territory Perfusion (Tmax>6.0s) volume: 136mL Mismatch Volume: 30mL Infarction Location:Right MCA vascular territory These results were communicated to Dr. Cheral Marker At 6:06 pmon 6/1/2021by text page via the [facility] messaging system. IMPRESSION: CTA neck: 1. The common carotid, internal carotid and vertebral arteries are patent within the neck without hemodynamically significant stenosis. Mild atherosclerotic plaque within the carotid bifurcations bilaterally. 2. The left internal carotid artery appears to originate distal to the left subclavian takeoff. 3. 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 4. Multifocal sclerotic lesions within the cervical and visualized thoracic spine suspicious for osseous metastatic disease. 5. Partially imaged pleural effusions (greater on the right). CTA  head: Abrupt occlusion of the distal M1 right middle cerebral artery. There is non opacification of adjacent proximal M2 right MCA branch vessels. There is poor opacification of more distal right MCA branch vessels. CT perfusion head: The perfusion software detects a 10 mL region of core infarction within the right MCA vascular territory, which is likely underestimated given findings on noncontrast head CT performed earlier the same day. The perfusion software detects a 107 mL region of hypoperfusion within the right MCA vascular territory utilizing a Tmax>6 seconds threshold. Reported mismatch volume: 97 mL. Electronically Signed   By: Kellie Simmering DO   On: 07/04/2019 18:16    PHYSICAL EXAM  Temp:  [93.5 F (34.2 C)-99.7 F (37.6 C)] 98.8 F (37.1 C) (06/02 0800) Pulse Rate:  [67-133] 79 (06/02 1130) Resp:  [14-22] 16 (06/02 1130) BP: (74-164)/(58-115)  119/73 (06/02 1130) SpO2:  [84 %-100 %] 100 % (06/02 1136) FiO2 (%):  [40 %-100 %] 40 % (06/02 1136) Weight:  [58.9 kg] 58.9 kg (06/01 2100)  General - Well nourished, well developed, intubated on sedation.  Ophthalmologic - fundi not visualized due to noncooperation.  Cardiovascular - irregularly irregular heart rate and rhythm.  Neuro - intubated on low dose sedation, eyes closed but slightly open with painful stimuli, not following commands. With forced eye opening, eyes in right gaze position, not blinking to visual threat, doll's eyes sluggish, not tracking, PERRL. Corneal reflex weak bilaterally, gag and cough present. Breathing over the vent.  Facial symmetry not able to test due to ET tube.  Tongue protrusion not cooperative. On pain stimulation, mild withdraw on the right UE and LE but no movement of LUE or LLE. DTR 1+ and no babinski. Sensation, coordination and gait not tested.   ASSESSMENT/PLAN Ms. SUNSHINE ZIPF is a 86 y.o. female with history of AF not on AC d/t fall risk, HTN, HLD, CKD presenting with left facial droop, severe dysarthria and left sided paralysis. Not a tPA candidate d/t time window. Sent to IR w/ mid R M1 occlusion.  Stroke:  R MCA infarct s/p IR w/ TICI3 revascularization and hemorrhagic transformation, infarct embolic secondary to known AF not on AC  CT head R basal ganglia, internal capsule, anterior R temporal lobe infarct. R M1 subtle hypodensity, ? Thrombus. No hemorrhage. ASPECTS 6    CTA head distal R M1 occlusion w/ no flow proximal  R M2 and poorly seen distal R MCA branches  CTA neck B ICA bifurcation atherosclerosis. 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 Multifocal sclerotic lesions cervical and thoracic spine suspiciou for osseous metastatic dz. R>L pleural effusions.   CT perfusion R MCA 64mL core infarct. 165mL hypoperfusion. Mismatch 7mL.  Cerebral angio mid R M1 occlusion s/p mechanical thrombectomy w/ TICI3  revascularization   Post IR CT no hemorrhage    MRI  Evolving R MCA territory infarct w/ possible hemorrhagic transformation R lentiform nucleus. R ICA siphon flow void, possible reocclusion.   CT head  Evolving R basal ganglia infarct w/ R lentiform nucleus hemorrhagic transformation. Mild edema.   CT repeat 6/2 combination of HT and contrast staining at right BG and caudate. Similar edema and mass effect   2D Echo pending  LDL 87  HgbA1c 6.6  SCDs for VTE prophylaxis  aspirin 81 mg daily prior to admission, now on No antithrombotic due to hemorrhagic conversion.  Therapy recommendations:  pending   Disposition:  pending   Acute Respiratory Insufficiency d/t encephalopathy  Intubated for IR, left intubated for airway protection  On propofol  CCM onboard   Not candidate for extubation today  Atrial Fibrillation  Home anticoagulation:  none d/t fall risk   Mild RVR  Resume home cardizem and metoprolol   Hypertensive Urgency  SBP 164/109 on arrival  Home meds:  Diltiazem 240, lasix 20, metoprolol 25, spironolactone 25   Treated with Cleviprex, now off  Stable  Resumed home cardizem and metoprolol . BP goal per IR x 24h following IR procedure 120-140, keep < 160 after 24h given HT . Long-term BP goal normotensive  Hyperlipidemia  Home meds:  liptitor 20   LDL 87, goal < 70  Resume lipitor 20 - no high dose statin due to advanced age and relatively low LDL  Continue statin at discharge  Prediabetes vs Diabetes type II   Home meds:  None  HgbA1c 6.6, at goal < 7.0  SSI  CBG monitoring  PCP follow up  Dysphagia . Secondary to stroke . NPO . TF via [employer] . Gentle IVF . Speech on board   Other Stroke Risk Factors  Advanced age  SSS  Other Active Problems  Glaucoma on timoptic   Osteopenia on fosamax PTA  Hypothyroidism on synthroid   Hospital day # 1  This patient is critically ill due to right MCA stroke, hemorrhagic  conversion, A. fib RVR, respiratory failure and at significant risk of neurological worsening, death form recurrent stroke, hematoma expansion, heart failure, cerebral edema, aspiration pneumonia and sepsis. This patient's care requires constant monitoring of vital signs, hemodynamics, respiratory and cardiac monitoring, review of multiple databases, neurological assessment, discussion with family, other specialists and medical decision making of high complexity. I spent 35 minutes of neurocritical care time in the care of this patient. I had long discussion with Son over the phone, updated pt current condition, treatment plan and potential prognosis, and answered all the questions. He expressed understanding and appreciation.   Rosalin Hawking, MD PhD Stroke Neurology 06/17/2019 4:09 PM   To contact Stroke Continuity provider, please refer to [email or web address] After hours, contact General Neurology

## 2019-06-17 NOTE — Progress Notes (Signed)
Echo attempted at 2:00, patient in CT. [place]

## 2019-06-17 NOTE — Progress Notes (Signed)
Pt BP now 110/68, goal was 120-140. Notified Rory Percy, verbal order received for BP to be between 100-140 with the new bleed.  Cleviprex order modified to reflect this change

## 2019-06-17 NOTE — Progress Notes (Signed)
SLP Cancellation Note  Patient Details Name: Danielle Malone MRN: [id number] DOB: [birth year]   Cancelled treatment:       Reason Eval/Treat Not Completed: Medical issues which prohibited therapy (on vent). Will f/u as able.    Osie Bond., M.A. [facility] Pager [phone] Office [phone]  06/17/2019, 11:03 AM

## 2019-06-17 NOTE — Progress Notes (Signed)
Referring Physician(s): Code Stroke- Kerney Elbe  Supervising Physician: Pedro Earls  Patient Status:  [facility] - In-pt  Chief Complaint: None- intubated with sedation  Subjective:  History of acute CVA s/p cerebral arteriogram with emergent mechanical thrombectomy of right MCA M1 occlusion achieving a TICI 3 revascularization via right femoral approach 07/12/2019 by Dr. Karenann Cai. Patient laying in bed intubated with sedation. She does not open eyes to voice or painful stimuli, does not follow simple commands. Can spontaneously move RUE, no spontaneous movements of other 3 extremities. Right groin incision c/d/i.   Allergies: Patient has no allergy information on record.  Medications: Prior to Admission medications   Not on File     Vital Signs: BP 131/78   Pulse (!) 133   Temp 98.8 F (37.1 C) (Axillary)   Resp 16   Wt 129 lb 13.6 oz (58.9 kg) Comment: Recorded 05/19/19  SpO2 98%   Physical Exam Vitals and nursing note reviewed.  Constitutional:      General: She is not in acute distress.    Comments: Intubated with sedation.  Pulmonary:     Effort: Pulmonary effort is normal. No respiratory distress.     Comments: Intubated with sedation. Skin:    General: Skin is warm and dry.  Neurological:     Comments: Intubated with sedation. She does not open eyes to voice/painful stimuli and does not follow simple commands. PERRL bilaterally. Right gaze preference. Can spontaneously move RUE, no spontaneous movements of other 3 extremities. Distal pulses (DPs) palpable bilaterally with Doppler.     Imaging: CT HEAD WO CONTRAST  Result Date: 06/18/2019 CLINICAL DATA:  Follow-up examination for acute stroke. EXAM: CT HEAD WITHOUT CONTRAST TECHNIQUE: Contiguous axial images were obtained from the base of the skull through the vertex without intravenous contrast. COMPARISON:  Prior brain MRI and CTs from earlier the same day. FINDINGS: Brain:  Evolving right MCA territory infarct involving the right basal ganglia with involvement of the right caudate and lentiform nuclei as well as the anterior right internal capsule again seen. Parenchymal hyperdensity involving the right lentiform nucleus measures 5.3 x 2.2 x 3.2 cm. While this at least in part likely reflects contrast staining related to recent arteriogram, appearance most concerning for hemorrhage given findings on prior MRI. Mild localized edema without significant regional mass effect at this time. No other evidence for acute intracranial hemorrhage. No other acute large vessel territory infarct. No mass lesion or extra-axial fluid collection. No hydrocephalus. Vascular: Contrast material seen diffusely throughout the intracranial circulation. Calcified atherosclerosis present at the skull base. Skull: Scalp soft tissues and calvarium within normal limits. Sinuses/Orbits: Globes and orbital soft tissues within normal limits. Mild scattered mucosal thickening noted within the ethmoidal air cells and sphenoid sinuses. Mastoid air cells are clear. Endotracheal tube partially visualized. Other: None. IMPRESSION: 1. Evolving right basal ganglia infarct, stable in size and distribution as compared to recent MRI. Superimposed 5.3 x 2.2 x 3.2 cm parenchymal hyperdensity involving the right lentiform nucleus most concerning for hemorrhagic transformation given findings on prior MRI. Mild localized edema without significant regional mass effect at this time. 2. No other new acute intracranial abnormality. These results were communicated to Dr. Rory Percy at 11:43 pmon 06/10/2021by text page via the [facility] messaging system. Electronically Signed   By: Jeannine Boga M.D.   On: 07/02/2019 23:44   MR BRAIN WO CONTRAST  Result Date: 06/16/2019 CLINICAL DATA:  Initial evaluation for neuro deficit, stroke suspected. EXAM:  MRI HEAD WITHOUT CONTRAST TECHNIQUE: Multiplanar, multiecho pulse sequences of the brain and  surrounding structures were obtained without intravenous contrast. COMPARISON:  Comparison made with prior CTs from earlier the same day. FINDINGS: Brain: Cerebral volume within normal limits for age. Mild chronic microvascular ischemic disease present within the periventricular white matter and pons. Associated small remote lacunar infarcts noted about the thalami. Evolving acute ischemic right MCA territory infarct seen involving the right basal ganglia, with involvement of the right caudate and lentiform nuclei as well as the anterior limb of the right internal capsule. Associated susceptibility artifact consistent with blood products. Suggestion of possible hemorrhagic transformation with intraparenchymal hemorrhage at the level of the right lentiform nucleus. Mild localized edema with partial effacement of the adjacent right lateral ventricle with trace localized right-to-left midline shift. No hydrocephalus or trapping. No other evidence for acute or subacute ischemia seen elsewhere within the brain. Gray-white matter differentiation otherwise maintained. No mass lesion or mass effect elsewhere within the brain. No extra-axial fluid collection. Pituitary gland suprasellar region normal. Midline structures intact. Vascular: Slight asymmetry within the intravascular flow void at the right carotid siphon as compared to the left, suspected to be related to slow flow (series 10, image 7). Major intracranial vascular flow voids otherwise maintained. Skull and upper cervical spine: Craniocervical junction within normal limits. Bone marrow signal intensity normal. No scalp soft tissue abnormality. Sinuses/Orbits: Patient status post bilateral ocular lens replacement. Scattered mucosal thickening noted within the sphenoid ethmoidal sinuses. Paranasal sinuses are otherwise largely clear. No significant mastoid effusion. Inner ear structures grossly normal. Fluid noted layering within the nasopharynx. Other: None.  IMPRESSION: 1. Evolving acute ischemic right MCA territory infarct involving the right basal ganglia, with suggestion of possible hemorrhagic transformation at the right lentiform nucleus. Correlation with follow-up head CT suggested for further evaluation. 2. Slight asymmetry within the intravascular flow void at the right carotid siphon as compared to the left, suspected to be related to slow flow. If there is clinical concern for possible reocclusion, further assessment with dedicated CTA and/or MRA could be performed for further evaluation as warranted. 3. Underlying age-related cerebral atrophy with mild chronic small vessel ischemic disease. Critical Value/emergent results were called by telephone at the time of interpretation on 07/09/2019 at 10:59 pm to provider Dr. Rory Percy, who verbally acknowledged these results. Electronically Signed   By: Jeannine Boga M.D.   On: 07/07/2019 23:01   CT CEREBRAL PERFUSION W CONTRAST  Result Date: 07/02/2019 CLINICAL DATA:  Neuro deficit, acute, stroke suspected. EXAM: CT ANGIOGRAPHY HEAD AND NECK CT PERFUSION BRAIN TECHNIQUE: Multidetector CT imaging of the head and neck was performed using the standard protocol during bolus administration of intravenous contrast. Multiplanar CT image reconstructions and MIPs were obtained to evaluate the vascular anatomy. Carotid stenosis measurements (when applicable) are obtained utilizing NASCET criteria, using the distal internal carotid diameter as the denominator. Multiphase CT imaging of the brain was performed following IV bolus contrast injection. Subsequent parametric perfusion maps were calculated using RAPID software. CONTRAST:  100 mL Omnipaque 350 intravenous contrast COMPARISON:  Noncontrast head CT performed earlier the same day 07/01/2019 FINDINGS: CTA NECK FINDINGS Aortic arch: The left internal carotid artery appears to originate beyond the origin of the left subclavian takeoff. Atherosclerotic calcification  within the visualized aortic arch and proximal major branch vessels of the neck. No hemodynamically significant innominate or proximal subclavian artery stenosis. Right carotid system: CCA and ICA patent within the neck without significant stenosis (50% or greater). Mild atherosclerotic  plaque within the carotid bifurcation. Left carotid system: CCA and ICA patent within the neck without significant stenosis (50% or greater). Mild atherosclerotic plaque within the carotid bifurcation. Vertebral arteries: Codominant and patent within the neck without significant stenosis. 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 Skeleton: Numerous sclerotic lesions within the cervical and visualized thoracic spine suspicious for osseous metastatic disease. Cervical spondylosis. Other neck: No neck mass or cervical lymphadenopathy. Upper chest: Partially imaged bilateral pleural effusions (greater on the right with associated compressive atelectasis. Review of the MIP images confirms the above findings CTA HEAD FINDINGS Anterior circulation: The intracranial internal carotid arteries are patent without significant stenosis. Mild calcified plaque within these vessels. There is abrupt occlusion of the distal M1 right middle cerebral artery. There is non opacification of the adjacent proximal right M2 branch vessels. There is poor opacification of distal M2 and more distal right MCA branch vessels. The M1 left middle cerebral artery is patent without significant stenosis. No left M2 proximal branch occlusion is identified. The anterior cerebral arteries are patent without high-grade proximal stenosis. No intracranial aneurysm is identified. Posterior circulation: The intracranial vertebral arteries are patent without significant stenosis, as is the basilar artery. The posterior cerebral arteries are patent proximally without  significant stenosis. Posterior communicating arteries are present bilaterally. Venous sinuses: Within limitations of contrast timing, no convincing thrombus. Anatomic variants: As described Review of the MIP images confirms the above findings CT Brain Perfusion Findings: ASPECTS: 6 CBF (<30%) Volume: 79mL within the right MCA vascular territory Perfusion (Tmax>6.0s) volume: 147mL Mismatch Volume: 30mL Infarction Location:Right MCA vascular territory These results were communicated to Dr. Cheral Marker At 6:06 pmon 06/16/2021by text page via the [facility] messaging system. IMPRESSION: CTA neck: 1. The common carotid, internal carotid and vertebral arteries are patent within the neck without hemodynamically significant stenosis. Mild atherosclerotic plaque within the carotid bifurcations bilaterally. 2. The left internal carotid artery appears to originate distal to the left subclavian takeoff. 3. 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 4. Multifocal sclerotic lesions within the cervical and visualized thoracic spine suspicious for osseous metastatic disease. 5. Partially imaged pleural effusions (greater on the right). CTA head: Abrupt occlusion of the distal M1 right middle cerebral artery. There is non opacification of adjacent proximal M2 right MCA branch vessels. There is poor opacification of more distal right MCA branch vessels. CT perfusion head: The perfusion software detects a 10 mL region of core infarction within the right MCA vascular territory, which is likely underestimated given findings on noncontrast head CT performed earlier the same day. The perfusion software detects a 107 mL region of hypoperfusion within the right MCA vascular territory utilizing a Tmax>6 seconds threshold. Reported mismatch volume: 97 mL. Electronically Signed   By: Kellie Simmering DO   On: 07/10/2019 18:16   DG CHEST PORT 1 VIEW  Result Date: 06/16/2019 CLINICAL  DATA:  Screening for metal prior to MRI EXAM: PORTABLE CHEST 1 VIEW COMPARISON:  11/28/2017 chest radiograph. FINDINGS: Endotracheal tube tip is 4.7 cm above the carina. Several extrinsic EKG leads overlie the chest bilaterally. Otherwise no radiopaque foreign bodies. Stable cardiomediastinal silhouette with mild cardiomegaly. No pneumothorax. Small bilateral pleural effusions. Mild-to-moderate pulmonary edema. IMPRESSION: 1. Endotracheal tube tip is 4.7 cm above the carina. Extrinsic EKG leads overlie the chest bilaterally. Otherwise no radiopaque foreign bodies. 2.  Mild-to-moderate congestive heart failure with small bilateral pleural effusions. Electronically Signed   By: Ilona Sorrel M.D.   On: 07/03/2019 20:52   DG Abd Portable 1V  Result Date: 06/24/2019 CLINICAL DATA:  Screening for metal prior to MRI EXAM: PORTABLE ABDOMEN - 1 VIEW COMPARISON:  10/30/2015 CT abdomen/pelvis FINDINGS: Excreted contrast fills the urinary bladder and renal collecting systems. Extrinsic EKG leads overlie the lower chest and upper abdomen. Metallic device overlies the lateral lower right ribs, presumably extrinsic. Catheter overlies the right pelvis. Otherwise no radiopaque foreign bodies. No dilated small bowel loops. No evidence of pneumatosis or pneumoperitoneum. IMPRESSION: Catheter overlies the right pelvis. EKG leads overlie the lower chest and upper abdomen. Metallic device overlies the lateral lower right ribs, presumably extrinsic. Otherwise no radiopaque foreign bodies. Excreted contrast fills the urinary bladder and renal collecting systems. Nonobstructive bowel gas pattern. Electronically Signed   By: Ilona Sorrel M.D.   On: 06/28/2019 20:54   CT HEAD CODE STROKE WO CONTRAST  Result Date: 06/29/2019 CLINICAL DATA:  Code stroke. Ataxia, stroke suspected. Additional history provided: Aphasia. EXAM: CT HEAD WITHOUT CONTRAST TECHNIQUE: Contiguous axial images were obtained from the base of the skull through the  vertex without intravenous contrast. COMPARISON:  Brain MRI 01/31/2016, head CT 01/15/2016 FINDINGS: Brain: There is abnormal hypodensity consistent with acute ischemic infarction involving the right caudate and lentiform nuclei as well as right internal capsule and portions of the lateral right thalamus. There is also probable subtle loss of gray-white differentiation within the anterior right temporal lobe. There is no evidence of acute intracranial hemorrhage Mild ill-defined hypoattenuation within the cerebral white matter is nonspecific, but consistent with chronic small vessel ischemic disease. Mild generalized parenchymal atrophy. No extra-axial fluid collection. No evidence of intracranial mass. No midline shift. Vascular: Subtle hyperdensity is questioned in the region of the distal M1 right MCA. Skull: Normal. Negative for fracture or focal lesion. Sinuses/Orbits: Visualized orbits show no acute finding. Mild paranasal sinus mucosal thickening. No significant mastoid effusion. ASPECTS ([place] Stroke Program Early CT Score) - Ganglionic level infarction (caudate, lentiform nuclei, internal capsule, insula, M1-M3 cortex): 3 - Supraganglionic infarction (M4-M6 cortex): 3 Total score (0-10 with 10 being normal): 6 IMPRESSION: Acute ischemic infarction changes involving the right basal ganglia, internal capsule and anterior right temporal lobe. ASPECTS is 6. Subtle hyperdensity is questioned in the region of the distal M1 right MCA, which may reflect thrombus. Correlate with pending CTA head/neck No evidence of acute intracranial hemorrhage. Background mild generalized parenchymal atrophy and chronic small vessel ischemic disease. Electronically Signed   By: Kellie Simmering DO   On: 07/13/2019 17:50   CT ANGIO HEAD CODE STROKE  Result Date: 07/03/2019 CLINICAL DATA:  Neuro deficit, acute, stroke suspected. EXAM: CT ANGIOGRAPHY HEAD AND NECK CT PERFUSION BRAIN TECHNIQUE: Multidetector CT imaging of the head and  neck was performed using the standard protocol during bolus administration of intravenous contrast. Multiplanar CT image reconstructions and MIPs were obtained to evaluate the vascular anatomy. Carotid stenosis measurements (when applicable) are obtained utilizing NASCET criteria, using the distal internal carotid diameter as the denominator. Multiphase CT imaging of the brain was performed following IV bolus contrast injection. Subsequent parametric perfusion maps were calculated using RAPID software. CONTRAST:  100 mL Omnipaque 350 intravenous contrast COMPARISON:  Noncontrast head CT performed earlier the same day 07/03/2019 FINDINGS: CTA NECK FINDINGS Aortic arch: The left internal carotid artery appears to originate beyond the origin of the left subclavian takeoff. Atherosclerotic calcification within  the visualized aortic arch and proximal major branch vessels of the neck. No hemodynamically significant innominate or proximal subclavian artery stenosis. Right carotid system: CCA and ICA patent within the neck without significant stenosis (50% or greater). Mild atherosclerotic plaque within the carotid bifurcation. Left carotid system: CCA and ICA patent within the neck without significant stenosis (50% or greater). Mild atherosclerotic plaque within the carotid bifurcation. Vertebral arteries: Codominant and patent within the neck without significant stenosis. 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 Skeleton: Numerous sclerotic lesions within the cervical and visualized thoracic spine suspicious for osseous metastatic disease. Cervical spondylosis. Other neck: No neck mass or cervical lymphadenopathy. Upper chest: Partially imaged bilateral pleural effusions (greater on the right with associated compressive atelectasis. Review of the MIP images confirms the above findings CTA HEAD FINDINGS Anterior  circulation: The intracranial internal carotid arteries are patent without significant stenosis. Mild calcified plaque within these vessels. There is abrupt occlusion of the distal M1 right middle cerebral artery. There is non opacification of the adjacent proximal right M2 branch vessels. There is poor opacification of distal M2 and more distal right MCA branch vessels. The M1 left middle cerebral artery is patent without significant stenosis. No left M2 proximal branch occlusion is identified. The anterior cerebral arteries are patent without high-grade proximal stenosis. No intracranial aneurysm is identified. Posterior circulation: The intracranial vertebral arteries are patent without significant stenosis, as is the basilar artery. The posterior cerebral arteries are patent proximally without significant stenosis. Posterior communicating arteries are present bilaterally. Venous sinuses: Within limitations of contrast timing, no convincing thrombus. Anatomic variants: As described Review of the MIP images confirms the above findings CT Brain Perfusion Findings: ASPECTS: 6 CBF (<30%) Volume: 36mL within the right MCA vascular territory Perfusion (Tmax>6.0s) volume: 156mL Mismatch Volume: 6mL Infarction Location:Right MCA vascular territory These results were communicated to Dr. Cheral Marker At 6:06 pmon 06/08/2021by text page via the [facility] messaging system. IMPRESSION: CTA neck: 1. The common carotid, internal carotid and vertebral arteries are patent within the neck without hemodynamically significant stenosis. Mild atherosclerotic plaque within the carotid bifurcations bilaterally. 2. The left internal carotid artery appears to originate distal to the left subclavian takeoff. 3. 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 4. Multifocal sclerotic lesions within the cervical and visualized thoracic spine suspicious for osseous metastatic disease.  5. Partially imaged pleural effusions (greater on the right). CTA head: Abrupt occlusion of the distal M1 right middle cerebral artery. There is non opacification of adjacent proximal M2 right MCA branch vessels. There is poor opacification of more distal right MCA branch vessels. CT perfusion head: The perfusion software detects a 10 mL region of core infarction within the right MCA vascular territory, which is likely underestimated given findings on noncontrast head CT performed earlier the same day. The perfusion software detects a 107 mL region of hypoperfusion within the right MCA vascular territory utilizing a Tmax>6 seconds threshold. Reported mismatch volume: 97 mL. Electronically Signed   By: Kellie Simmering DO   On: 06/17/2019 18:16   CT ANGIO NECK CODE STROKE  Result Date: 06/16/2019 CLINICAL DATA:  Neuro deficit, acute, stroke suspected. EXAM: CT ANGIOGRAPHY HEAD AND NECK CT PERFUSION BRAIN TECHNIQUE: Multidetector CT imaging of the head and neck was performed using the standard protocol during bolus administration of intravenous contrast. Multiplanar CT image reconstructions  and MIPs were obtained to evaluate the vascular anatomy. Carotid stenosis measurements (when applicable) are obtained utilizing NASCET criteria, using the distal internal carotid diameter as the denominator. Multiphase CT imaging of the brain was performed following IV bolus contrast injection. Subsequent parametric perfusion maps were calculated using RAPID software. CONTRAST:  100 mL Omnipaque 350 intravenous contrast COMPARISON:  Noncontrast head CT performed earlier the same day 06/21/2019 FINDINGS: CTA NECK FINDINGS Aortic arch: The left internal carotid artery appears to originate beyond the origin of the left subclavian takeoff. Atherosclerotic calcification within the visualized aortic arch and proximal major branch vessels of the neck. No hemodynamically significant innominate or proximal subclavian artery stenosis. Right  carotid system: CCA and ICA patent within the neck without significant stenosis (50% or greater). Mild atherosclerotic plaque within the carotid bifurcation. Left carotid system: CCA and ICA patent within the neck without significant stenosis (50% or greater). Mild atherosclerotic plaque within the carotid bifurcation. Vertebral arteries: Codominant and patent within the neck without significant stenosis. 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 Skeleton: Numerous sclerotic lesions within the cervical and visualized thoracic spine suspicious for osseous metastatic disease. Cervical spondylosis. Other neck: No neck mass or cervical lymphadenopathy. Upper chest: Partially imaged bilateral pleural effusions (greater on the right with associated compressive atelectasis. Review of the MIP images confirms the above findings CTA HEAD FINDINGS Anterior circulation: The intracranial internal carotid arteries are patent without significant stenosis. Mild calcified plaque within these vessels. There is abrupt occlusion of the distal M1 right middle cerebral artery. There is non opacification of the adjacent proximal right M2 branch vessels. There is poor opacification of distal M2 and more distal right MCA branch vessels. The M1 left middle cerebral artery is patent without significant stenosis. No left M2 proximal branch occlusion is identified. The anterior cerebral arteries are patent without high-grade proximal stenosis. No intracranial aneurysm is identified. Posterior circulation: The intracranial vertebral arteries are patent without significant stenosis, as is the basilar artery. The posterior cerebral arteries are patent proximally without significant stenosis. Posterior communicating arteries are present bilaterally. Venous sinuses: Within limitations of contrast timing, no convincing thrombus. Anatomic  variants: As described Review of the MIP images confirms the above findings CT Brain Perfusion Findings: ASPECTS: 6 CBF (<30%) Volume: 79mL within the right MCA vascular territory Perfusion (Tmax>6.0s) volume: 170mL Mismatch Volume: 72mL Infarction Location:Right MCA vascular territory These results were communicated to Dr. Cheral Marker At 6:06 pmon 6/1/2021by text page via the [facility] messaging system. IMPRESSION: CTA neck: 1. The common carotid, internal carotid and vertebral arteries are patent within the neck without hemodynamically significant stenosis. Mild atherosclerotic plaque within the carotid bifurcations bilaterally. 2. The left internal carotid artery appears to originate distal to the left subclavian takeoff. 3. 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 4. Multifocal sclerotic lesions within the cervical and visualized thoracic spine suspicious for osseous metastatic disease. 5. Partially imaged pleural effusions (greater on the right). CTA head: Abrupt occlusion of the distal M1 right middle cerebral artery. There is non opacification of adjacent proximal M2 right MCA branch vessels. There is poor opacification of more distal right MCA branch vessels. CT perfusion head: The perfusion software detects a 10 mL region of core infarction within the right MCA vascular territory, which is likely underestimated given findings on noncontrast head CT performed earlier the same  day. The perfusion software detects a 107 mL region of hypoperfusion within the right MCA vascular territory utilizing a Tmax>6 seconds threshold. Reported mismatch volume: 97 mL. Electronically Signed   By: Kellie Simmering DO   On: 07/09/2019 18:16    Labs:  CBC: Recent Labs    07/04/2019 1724  WBC 8.1  HGB 14.9  HCT 45.6  PLT 184    COAGS: Recent Labs    06/20/2019 1724  INR 1.1  APTT 35    BMP: Recent Labs    06/27/2019 1724  NA 132*  K 4.2  CL 96*   CO2 21*  GLUCOSE 143*  BUN 14  CALCIUM 8.9  CREATININE 0.78  GFRNONAA >60  GFRAA >60    LIVER FUNCTION TESTS: Recent Labs    07/01/2019 1724  BILITOT 1.7*  AST 22  ALT 17  ALKPHOS 57  PROT 6.7  ALBUMIN 3.6    Assessment and Plan:  History of acute CVA s/p cerebral arteriogram with emergent mechanical thrombectomy of right MCA M1 occlusion achieving a TICI 3 revascularization via right femoral approach 07/04/2019 by Dr. Karenann Cai. Patient's condition stable- remains intubated/sedated, can spontaneously move RUE, no movements of other 3 extremities. Right groin incision stable, distal pulses (DPs) palpable bilaterally with Doppler. For CT head today. Further plans per neurology- appreciate and agree with management. Please call NIR with questions/concerns.   Electronically Signed: Earley Abide, PA-C 06/17/2019, 10:14 AM   I spent a total of 25 Minutes at the the patient's bedside AND on the patient's hospital floor or unit, greater than 50% of which was counseling/coordinating care for CVA s/p revascularization.

## 2019-06-17 NOTE — Progress Notes (Signed)
Urine output decreasing over shift, MD Agarwala made aware 1345.    HR maintains afib though now 50s-60s after home medication given (previously 120s), MD Agarwala made aware 1515.

## 2019-06-17 NOTE — Progress Notes (Signed)
OT Cancellation Note  Patient Details Name: Danielle Malone MRN: [id number] DOB: [birth year]   Cancelled Treatment:    Reason Eval/Treat Not Completed: Patient at procedure or test/ unavailable;Medical issues which prohibited therapy; pt currently out of room (for CT). Noted pt also remains intubated and on sedation with minimal to no command follow. Will follow up as able for OT eval.  Lou Cal, OT Acute Rehabilitation Services Pager [phone] Office [phone]   Raymondo Band 06/17/2019, 2:12 PM

## 2019-06-17 NOTE — Progress Notes (Signed)
Initial Nutrition Assessment  DOCUMENTATION CODES:   Severe malnutrition in context of chronic illness  INTERVENTION:   Initiate tube feeding via Cortrak tube: Vital AF 1.2 at 45 ml/h (1080 ml per day)  Provides 1296 kcal, 94 gm protein, 875 ml free water daily  Monitor magnesium and phosphorus every 12 hours x 4 occurances, MD to replete as needed, as pt is at risk for refeeding syndrome given severe malnutrition.   NUTRITION DIAGNOSIS:   Severe Malnutrition related to chronic illness(pulmonary HTN, CKD) as evidenced by severe muscle depletion, severe fat depletion.  GOAL:   Patient will meet greater than or equal to 90% of their needs  MONITOR:   TF tolerance, Vent status, Labs  REASON FOR ASSESSMENT:   Consult, Ventilator Enteral/tube feeding initiation and management  ASSESSMENT:   Pt with PMH of Afib, pulmonary HTN, O2 dependent at night, and CKD stage III admitted with acute R MCA stroke s/p IR for thrombectomy now with hemorraghic conversion.    Pt discussed with RN.  Cortrak placed by this RD. No family present. Pt did not open her eyes during placement. She did move her RUE to pain. Per H&P pt is able to perform her ADLs and walks on her own without a walker. She lives with her son.   Patient is currently intubated on ventilator support MV: 6.2 L/min Temp (24hrs), Avg:97.3 F (36.3 C), Min:93.5 F (34.2 C), Max:99.7 F (37.6 C)  Propofol: 3 ml/hr provides: 79 kcal  Cleviprex @ 2 ml/hr provides: 96 kcal  Medications reviewed and include: 2-6 units novolog every 4 hours  Labs reviewed: Na 132 (L)    NUTRITION - FOCUSED PHYSICAL EXAM:    Most Recent Value  Orbital Region  Severe depletion  Upper Arm Region  Moderate depletion  Thoracic and Lumbar Region  Moderate depletion  Buccal Region  Severe depletion  Temple Region  Severe depletion  Clavicle Bone Region  Severe depletion  Clavicle and Acromion Bone Region  Severe depletion  Scapular Bone  Region  Unable to assess  Dorsal Hand  Mild depletion  Patellar Region  No depletion  Anterior Thigh Region  No depletion  Posterior Calf Region  No depletion  Edema (RD Assessment)  None  Hair  Reviewed  Eyes  Unable to assess  Mouth  Unable to assess  Skin  Reviewed [fragile, thin]  Nails  Reviewed       Diet Order:   Diet Order            Diet NPO time specified  Diet effective now              EDUCATION NEEDS:   No education needs have been identified at this time  Skin:  Skin Assessment: Reviewed RN Assessment  Last BM:  unknown  Height:   Ht Readings from Last 1 Encounters:  No data found for Ht    Weight:   Wt Readings from Last 1 Encounters:  06/26/2019 58.9 kg    Ideal Body Weight:     BMI:  There is no height or weight on file to calculate BMI.  Estimated Nutritional Needs:   Kcal:  1300  Protein:  88-115 grams  Fluid:  1.5 L/day  Lockie Pares., RD, LDN, CNSC See AMiON for contact information

## 2019-06-17 NOTE — Anesthesia Postprocedure Evaluation (Signed)
Anesthesia Post Note  Patient: Danielle Malone  Procedure(s) Performed: IR WITH ANESTHESIA (N/A )     Patient location during evaluation: SICU Anesthesia Type: General Level of consciousness: sedated Pain management: pain level controlled Vital Signs Assessment: post-procedure vital signs reviewed and stable Respiratory status: patient remains intubated per anesthesia plan Cardiovascular status: stable Postop Assessment: no apparent nausea or vomiting Anesthetic complications: no    Last Vitals:  Vitals:   06/17/19 0900 06/17/19 0915  BP: 140/76 131/78  Pulse: (!) 102 (!) 133  Resp: 17 16  Temp:    SpO2: 99% 98%    Last Pain:  Vitals:   06/17/19 0800  TempSrc: Axillary  PainSc:                  [place] S

## 2019-06-17 NOTE — Consult Note (Signed)
..   NAME:  Danielle STEKETEE, MRN:  [id number], DOB:  22-Dec-1925, LOS: 1 ADMISSION DATE:  06/20/2019, CONSULTATION DATE:  07/05/2019 REFERRING MD:  Cheral Marker MD, CHIEF COMPLAINT:  Left hemiplegia s/p thrombectomy  Brief History   86 yr old F w/ PMHx sig for Afib, Pulm HTN, CKD stage 3, SSS presented on 06/01 with left hemiplegia LSN 0600  found to have an acute ischemic stroke involving distal M1Right MCA. Was not a candidate for TPA. POD 0 cerebral angiogram & mechanical thrombectomy of right M1 MCA with resolution of occlusive thrombus. MRI to follow.PCCM consulted for ventilator mgmt. History of present illness   ( History obtained from review of EMR and acct of other providers as patient is s/p thrombectomy and intubated acutely encephalopathic and on propofol)  86 yr old F w/ PMHx sig for chronic Afib, Pulm HTN, CKD stage 3, SSS presented on 06/01 with left hemiplegia, at her baseline she is able to perform her ADLs and walks on her own without a walker. She lives with her son and was LSN 0600  On 07/08/2019 Her son left the house and returned in the afternoon to find he with a left facial droop, severe dysarthria and left side paralysis.  EMS was called and pt arrived with the same deficits unable to answer basic questions. Due to her fall risk she was not on anticoagulation for her chronic Afib.  Code stroke was called on arrival and Pt was evaluated by neurology.  She was outside of the window of TPA and initial CTH was negative for hemorrhage and showed acute ischemic infacr of right basal ganglia and internal capsule wiuth right temporal lobew/ a subtle hyperdensity in M1 region. CTA confirmed abrupt occlusion involving distal M1 of the Right MCA. Pt was endotracheally intubated by Anesthesia prior to Sx intervention POD 0 cerebral angiogram & mechanical thrombectomy of right M1 MCA with resolution of occlusive thrombus.  MRI to follow. PCCM consulted for ventilator mgmt.  Past Medical History   Chronic atrial fibrillation Pulmonary HTN Essential HTN HLD CKD stage 3 O2 dependent at night Glaucoma Sacral fracture SSS  Home medications: ASA and Atorvastatin per Neurology documentation. None available on EMR at time of consult Significant Hospital Events   S/p mechanical thrombectomy  Consults:  Anesthesia IR PCCM  Procedures:  Endotracheally intubated POD 0 mechanical thrombectomy  Significant Diagnostic Tests:  06/27/2019 CT head: Acute ischemic infarction changes involving the right basal ganglia, internal capsule and anterior right temporal lobe. ASPECTS is 6. Subtle hyperdensity is questioned in the region of the distal M1 right MCA, which may reflect thrombus. Correlate with pending CTA Head/neck. No evidence of acute intracranial hemorrhage. Background mild generalized parenchymal atrophy and chronic small vessel ischemic disease.    06/21/2019 CTA NECK FINDINGS Aortic arch: The left internal carotid artery appears to originate beyond the origin of the left subclavian takeoff. Atherosclerotic calcification within the visualized aortic arch and proximal major branch vessels of the neck. No hemodynamically significant innominate or proximal subclavian artery stenosis. Right carotid system: CCA and ICA patent within the neck without significant stenosis (50% or greater). Mild atherosclerotic plaque within the carotid bifurcation. Left carotid system: CCA and ICA patent within the neck without significant stenosis (50% or greater). Mild atherosclerotic plaque within the carotid bifurcation. Vertebral arteries: Codominant and patent within the neck without significant stenosis. 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 Skeleton: Numerous sclerotic lesions within the cervical and visualized thoracic spine suspicious for osseous metastatic  disease. Cervical spondylosis. Other neck: No neck mass or cervical lymphadenopathy. Upper chest: Partially imaged bilateral pleural effusions (greater on the right with associated compressive atelectasis.  CTA HEAD FINDINGS Anterior circulation: The intracranial internal carotid arteries are patent without significant stenosis. Mild calcified plaque within these vessels. There is abrupt occlusion of the distal M1 right middle cerebral artery. There is non opacification of the adjacent proximal right M2 branch vessels. There is poor opacification of distal M2 and more distal right MCA branch vessels. The M1 left middle cerebral artery is patent without significant stenosis. No left M2 proximal branch occlusion is identified. The anterior cerebral arteries are patent without high-grade proximal stenosis. No intracranial aneurysm is identified.  Posterior circulation: The intracranial vertebral arteries are patent without significant stenosis, as is the basilar artery. The posterior cerebral arteries are patent proximally without significant stenosis. Posterior communicating arteries are present bilaterally. Venous sinuses: Within limitations of contrast timing, no convincing thrombus. Anatomic variants: As described CT Brain Perfusion Findings:  ASPECTS: 6   ECHO on 04/27/19 Left ventricular systolic function is normal.  LV ejection fraction = 60-65%.  No segmental wall motion abnormalities seen in the left ventricle  Left ventricular filling pattern is indeterminate.  The right ventricle is mildly dilated.  The right ventricular systolic function is moderately reduced.  The left atrium is severely dilated.  The right atrium is severely dilated.  There is mild to moderate aortic stenosis.  There is moderate mitral regurgitation.  Micro Data:  06/29/2019: SARSCOV2--> negative MRSA PCR sent on 06/01--> pending at time of consult  Antimicrobials:  none   Interim  history/subjective:  On evaluation post thrombectomy pt is rousable on propofol and cleviprex gtt SBP in 160 s moving her RUE. In no visible distress O2 sat 100% on FiO2 100% PEEP 5--> discussed with RT will wean FiO2 to keep O2 sat >92%  Objective   Blood pressure 129/85, pulse 77, temperature 98.9 F (37.2 C), temperature source Axillary, resp. rate 16, weight 58.9 kg, SpO2 100 %.    Vent Mode: PSV;CPAP FiO2 (%):  [40 %-100 %] 40 % Set Rate:  [16 bmp] 16 bmp Vt Set:  [420 mL] 420 mL PEEP:  [5 cmH20] 5 cmH20 Pressure Support:  [5 cmH20-10 cmH20] 5 cmH20 Plateau Pressure:  [16 cmH20-18 cmH20] 16 cmH20   Intake/Output Summary (Last 24 hours) at 06/17/2019 1733 Last data filed at 06/17/2019 1700 Gross per 24 hour  Intake 2645.33 ml  Output 1180 ml  Net 1465.33 ml   Filed Weights   06/20/2019 2100  Weight: 58.9 kg    Examination: General: thin female in no acute distress, sedated HENT: endotracheally intubated, moist oral mucosa no chelosis, normocephalic atraumatic Lungs: clear to auscultation bilaterally no wheezing on exp phase, no crackles at bases, tolerating SBT Cardiovascular: S1 and S2 irregularly irregular  Abdomen: soft scaphoid abdomen no grimace on plapation Extremities: b/l lower extremities have non pitting edema, no SCDs on at time of eval Neuro: moves RUE to command intermittently, consistently purposeful  GU: external urinary catheter in place  Assessment & Plan:  1. Acute encephalopathy s/p ischemic CVA of Right M1 MCA s/p mechanical thrombectomy  Hemorrhagic conversion on CT - prognosis for recovery now more guarded.  - Continue current support to see if any significant improvement over the next 48h.  2. Acute Respiratory failure requiring mechanical ventilation secondary to encephalopathy Airway protection -  Continue daily SBT but mental status precludes extubation.   3. BP control post mechanical thrombectomy Dilated cardiomyopathy with preserved LV fn EF  60-65%  with reduced RV fn and h/o Pulm HTN Also mild to moderate AS with moderate MR on last ECHO Has a h/o HTN and HLD - well controlled off clevedipine and on oral medications.    Best practice:  Diet: NPO for now if remains intubated start TF Pain/Anxiety/Delirium protocol (if indicated): on propofol gtt VAP protocol (if indicated): yes DVT prophylaxis: SCDs only GI prophylaxis: Protonix Glucose control: ISS goal BG 140-180 mg/dl Mobility: bedrest Code Status: Full Family Communication: Son updated at the bedside and informed regarding guarded prognosis.  Disposition: ICU  Labs   CBC: Recent Labs  Lab 07/10/2019 1724  WBC 8.1  NEUTROABS 7.0  HGB 14.9  HCT 45.6  MCV 88.5  PLT [id number]    Basic Metabolic Panel: Recent Labs  Lab 07/10/2019 1724 06/17/19 1229 06/17/19 1635  NA 132*  --   --   K 4.2  --   --   CL 96*  --   --   CO2 21*  --   --   GLUCOSE 143*  --   --   BUN 14  --   --   CREATININE 0.78  --   --   CALCIUM 8.9  --   --   MG  --  1.7 1.7  PHOS  --  3.5 3.3   GFR: CrCl cannot be calculated (Unknown ideal weight.). Recent Labs  Lab 06/27/2019 1724 06/23/2019 2135 06/17/19 0126  WBC 8.1  --   --   LATICACIDVEN  --  2.3* 1.4    Liver Function Tests: Recent Labs  Lab 06/28/2019 1724  AST 22  ALT 17  ALKPHOS 57  BILITOT 1.7*  PROT 6.7  ALBUMIN 3.6   No results for input(s): LIPASE, AMYLASE in the last 168 hours. No results for input(s): AMMONIA in the last 168 hours.  ABG No results found for: PHART, PCO2ART, PO2ART, HCO3, TCO2, ACIDBASEDEF, O2SAT   Coagulation Profile: Recent Labs  Lab 07/04/2019 1724  INR 1.1    Cardiac Enzymes: No results for input(s): CKTOTAL, CKMB, CKMBINDEX, TROPONINI in the last 168 hours.  HbA1C: Hgb A1c MFr Bld  Date/Time Value Ref Range Status  06/17/2019 01:26 AM 6.6 (H) 4.8 - 5.6 % Final    Comment:    (NOTE) Pre diabetes:          5.7%-6.4% Diabetes:              >6.4% Glycemic control for    <7.0% adults with diabetes     CBG: No results for input(s): GLUCAP in the last 168 hours. 06/17/2019 5:33 PM  CRITICAL CARE Performed by: Kipp Brood   Total critical care time: 40 minutes  Critical care time was exclusive of separately billable procedures and treating other patients.  Critical care was necessary to treat or prevent imminent or life-threatening deterioration.  Critical care was time spent personally by me on the following activities: development of treatment plan with patient and/or surrogate as well as nursing, discussions with consultants, evaluation of patient's response to treatment, examination of patient, obtaining history from patient or surrogate, ordering and performing treatments and interventions, ordering and review of laboratory studies, ordering and review of radiographic studies, pulse oximetry, re-evaluation of patient's condition and participation in multidisciplinary rounds.  Kipp Brood, MD [facility] ICU Physician [place]  Pager: [phone] Mobile:  [phone] After hours: [phone].

## 2019-06-17 NOTE — Progress Notes (Signed)
PT Cancellation Note  Patient Details Name: Danielle Malone MRN: [id number] DOB: [birth year]   Cancelled Treatment:    Reason Eval/Treat Not Completed: Patient at procedure or test/unavailable; patient down for CT.  Noted not yet following commands or weaned off sedation. PT to follow up another day.    Reginia Naas 06/17/2019, 2:06 PM  Magda Kiel, [place] [phone] 06/17/2019

## 2019-06-18 ENCOUNTER — Inpatient Hospital Stay (HOSPITAL_COMMUNITY): Payer: Medicare Other

## 2019-06-18 ENCOUNTER — Encounter (HOSPITAL_COMMUNITY): Payer: Self-pay | Admitting: Neurology

## 2019-06-18 DIAGNOSIS — I340 Nonrheumatic mitral (valve) insufficiency: Secondary | ICD-10-CM

## 2019-06-18 DIAGNOSIS — I361 Nonrheumatic tricuspid (valve) insufficiency: Secondary | ICD-10-CM

## 2019-06-18 DIAGNOSIS — I9589 Other hypotension: Secondary | ICD-10-CM

## 2019-06-18 DIAGNOSIS — J9690 Respiratory failure, unspecified, unspecified whether with hypoxia or hypercapnia: Secondary | ICD-10-CM | POA: Diagnosis not present

## 2019-06-18 LAB — PHOSPHORUS
Phosphorus: 1.8 mg/dL — ABNORMAL LOW (ref 2.5–4.6)
Phosphorus: 2.4 mg/dL — ABNORMAL LOW (ref 2.5–4.6)

## 2019-06-18 LAB — CBC
HCT: 38.5 % (ref 36.0–46.0)
Hemoglobin: 12.3 g/dL (ref 12.0–15.0)
MCH: 29.1 pg (ref 26.0–34.0)
MCHC: 31.9 g/dL (ref 30.0–36.0)
MCV: 91 fL (ref 80.0–100.0)
Platelets: 150 10*3/uL (ref 150–400)
RBC: 4.23 MIL/uL (ref 3.87–5.11)
RDW: 14.3 % (ref 11.5–15.5)
WBC: 9.7 10*3/uL (ref 4.0–10.5)
nRBC: 0 % (ref 0.0–0.2)

## 2019-06-18 LAB — BASIC METABOLIC PANEL
Anion gap: 9 (ref 5–15)
BUN: 19 mg/dL (ref 8–23)
CO2: 22 mmol/L (ref 22–32)
Calcium: 7.6 mg/dL — ABNORMAL LOW (ref 8.9–10.3)
Chloride: 104 mmol/L (ref 98–111)
Creatinine, Ser: 0.68 mg/dL (ref 0.44–1.00)
GFR calc Af Amer: >60 mL/min (ref >60)
GFR calc non Af Amer: >60 mL/min (ref >60)
Glucose, Bld: 128 mg/dL — ABNORMAL HIGH (ref 70–99)
Potassium: 3.6 mmol/L (ref 3.5–5.1)
Sodium: 135 mmol/L (ref 135–145)

## 2019-06-18 LAB — GLUCOSE, CAPILLARY
Glucose-Capillary: 108 mg/dL — ABNORMAL HIGH (ref 70–99)
Glucose-Capillary: 111 mg/dL — ABNORMAL HIGH (ref 70–99)
Glucose-Capillary: 120 mg/dL — ABNORMAL HIGH (ref 70–99)
Glucose-Capillary: 139 mg/dL — ABNORMAL HIGH (ref 70–99)
Glucose-Capillary: 157 mg/dL — ABNORMAL HIGH (ref 70–99)
Glucose-Capillary: 77 mg/dL (ref 70–99)

## 2019-06-18 LAB — ECHOCARDIOGRAM COMPLETE: Weight: 2077.62 oz

## 2019-06-18 LAB — MAGNESIUM
Magnesium: 1.9 mg/dL (ref 1.7–2.4)
Magnesium: 1.7 mg/dL (ref 1.7–2.4)

## 2019-06-18 LAB — TRIGLYCERIDES: Triglycerides: 42 mg/dL (ref <150)

## 2019-06-18 MED ORDER — DOCUSATE SODIUM 50 MG/5ML PO LIQD
100.0000 mg | Freq: Two times a day (BID) | ORAL | Status: DC
  Administered 2019-06-18 – 2019-06-21 (×6): 100 mg
  Filled 2019-06-18 (×6): qty 10

## 2019-06-18 MED ORDER — METOPROLOL TARTRATE 25 MG PO TABS
12.5000 mg | ORAL_TABLET | Freq: Two times a day (BID) | ORAL | Status: DC
  Administered 2019-06-18 – 2019-06-19 (×3): 12.5 mg
  Filled 2019-06-18 (×3): qty 1

## 2019-06-18 MED ORDER — POLYETHYLENE GLYCOL 3350 17 G PO PACK: 17.0000 g | PACK | Freq: Every day | ORAL | Status: DC

## 2019-06-18 MED ORDER — SODIUM PHOSPHATES 45 MMOLE/15ML IV SOLN
20.0000 mmol | Freq: Once | INTRAVENOUS | Status: AC
  Administered 2019-06-18: 20 mmol via INTRAVENOUS
  Filled 2019-06-18: qty 6.67

## 2019-06-18 MED ORDER — MIDAZOLAM HCL 2 MG/2ML IJ SOLN: 1.0000 mg | INTRAMUSCULAR | Status: DC | PRN

## 2019-06-18 MED ORDER — FENTANYL CITRATE (PF) 100 MCG/2ML IJ SOLN
25.0000 ug | INTRAMUSCULAR | Status: DC | PRN
  Administered 2019-06-18 – 2019-06-19 (×3): 50 ug via INTRAVENOUS
  Filled 2019-06-18 (×3): qty 2

## 2019-06-18 MED ORDER — POLYETHYLENE GLYCOL 3350 17 G PO PACK
17.0000 g | PACK | Freq: Every day | ORAL | Status: DC
  Administered 2019-06-18 – 2019-06-21 (×3): 17 g
  Filled 2019-06-18 (×3): qty 1

## 2019-06-18 MED ORDER — PANTOPRAZOLE SODIUM 40 MG PO PACK
40.0000 mg | PACK | Freq: Every day | ORAL | Status: DC
  Administered 2019-06-18 – 2019-06-21 (×4): 40 mg
  Filled 2019-06-18 (×4): qty 20

## 2019-06-18 MED ORDER — DOCUSATE SODIUM 50 MG/5ML PO LIQD: 100.0000 mg | Freq: Two times a day (BID) | ORAL | Status: DC

## 2019-06-18 NOTE — Progress Notes (Signed)
Spoke with CCM about mild hypotension. Scatliff doesn't suggest any pressers at this time due risk for worsening her hemorrhage.  Ok with MAP >65. WCTM

## 2019-06-18 NOTE — Progress Notes (Signed)
°  Echocardiogram 2D Echocardiogram has been attempted. Patient with PT. Will reattempt at later time.  Beth Goodlin G Veleria Barnhardt 06/18/2019, 11:38 AM

## 2019-06-18 NOTE — Progress Notes (Signed)
  Echocardiogram 2D Echocardiogram has been performed.  Randa Lynn Velvet Moomaw 06/18/2019, 2:41 PM

## 2019-06-18 NOTE — Progress Notes (Addendum)
63 RN entered room to boost/turn patient with NT--while flat, noticed hematoma approximately 2.5in x 1.5in.  Pressure held, MD Karenann Cai paged, orders to continue holding pressure x11min, HOB flat x3 hr and reorder bedrest.    1720 MD Karenann Cai at bedside to assess patient.

## 2019-06-18 NOTE — Progress Notes (Signed)
Inpatient Rehab Admissions Coordinator Note:   Per PT recommendations, pt was screened for CIR candidacy by Shann Medal, PT, DPT.  At this time pt is still requiring mechanical ventilation.  Will follow from a distance.   Shann Medal, PT, DPT [phone] 06/18/19 3:34 PM

## 2019-06-18 NOTE — Progress Notes (Signed)
STROKE TEAM PROGRESS NOTE   INTERVAL HISTORY RN at bedside. Pt still intubated and off propofol for 2 hours. She is still very lethargic, barely open eyes, however, she is able to follow all simple commands on the right UE and LE. Not candidate for extubation today. I later updated son and daughter at bedside.     Vitals:   06/18/19 0900 06/18/19 0930 06/18/19 1000 06/18/19 1030  BP: 139/81 129/85 121/82 128/84  Pulse: 96 98 79 84  Resp: 19 19 14 15   Temp:      TempSrc:      SpO2: 100% 99% 99% 100%  Weight:       CBC:  Recent Labs  Lab 06/26/2019 1724 07/07/2019 1727 06/17/19 0342 06/18/19 0625  WBC 8.1  --   --  9.7  NEUTROABS 7.0  --   --   --   HGB 14.9   < > 12.9 12.3  HCT 45.6   < > 38.0 38.5  MCV 88.5  --   --  91.0  PLT 184  --   --  150   < > = values in this interval not displayed.   Basic Metabolic Panel:  Recent Labs  Lab 07/09/2019 1724 07/08/2019 1724 06/18/2019 1727 06/28/2019 1727 06/17/19 0342 06/17/19 1229 06/17/19 1635 06/18/19 0625  NA 132*   < > 131*   < > 134*  --   --  135  K 4.2   < > 4.0   < > 3.7  --   --  3.6  CL 96*   < > 95*  --   --   --   --  104  CO2 21*  --   --   --   --   --   --  22  GLUCOSE 143*   < > 141*  --   --   --   --  128*  BUN 14   < > 16  --   --   --   --  19  CREATININE 0.78   < > 0.40*  --   --   --   --  0.68  CALCIUM 8.9  --   --   --   --   --   --  7.6*  MG  --   --   --   --   --    < > 1.7 1.9  PHOS  --   --   --   --   --    < > 3.3 1.8*   < > = values in this interval not displayed.   Lipid Panel:     Component Value Date/Time   CHOL 174 06/17/2019 0126   TRIG 42 06/18/2019 0625   HDL 73 06/17/2019 0126   CHOLHDL 2.4 06/17/2019 0126   VLDL 14 06/17/2019 0126   LDLCALC 87 06/17/2019 0126   HgbA1c:  Lab Results  Component Value Date   HGBA1C 6.6 (H) 06/17/2019   Urine Drug Screen:     Component Value Date/Time   LABOPIA NONE DETECTED 07/10/2019 1955   COCAINSCRNUR NONE DETECTED 06/21/2019 1955   LABBENZ NONE DETECTED 07/12/2019 1955   AMPHETMU NONE DETECTED 07/01/2019 1955   THCU NONE DETECTED 07/02/2019 1955   LABBARB NONE DETECTED 07/04/2019 1955    Alcohol Level     Component Value Date/Time   ETH <10 07/13/2019 1724    IMAGING past 24 hours CT HEAD WO CONTRAST  Result Date:  06/17/2019 CLINICAL DATA:  Stroke post revascularization, follow-up EXAM: CT HEAD WITHOUT CONTRAST TECHNIQUE: Contiguous axial images were obtained from the base of the skull through the vertex without intravenous contrast. COMPARISON:  07/07/2019 FINDINGS: Brain: Evolving right MCA territory infarction is again identified primarily involving the basal ganglia and adjacent white matter. Hemorrhagic transformation about the right lentiform nucleus is stable in size with mild associated edema. Contrast staining is also present in this region with some areas of decreased hyperdensity. There is decreased hyperdensity along the caudate reflecting contrast staining superimposed on petechial hemorrhage. Stable partial effacement of the right lateral ventricle without midline shift or hydrocephalus. Stable findings of chronic microvascular ischemic changes. Vascular: There is atherosclerotic calcification at the skull base. Skull: Calvarium is unremarkable. Sinuses/Orbits: No acute finding. Other: Mastoid air cells are clear. IMPRESSION: Evolving recent right MCA territory infarction. There is a combination of hemorrhagic transformation and contrast staining about the right lentiform nucleus and to a lesser extent along the caudate. Similar associated edema and mild mass effect. Electronically Signed   By: Macy Mis M.D.   On: 06/17/2019 14:44    PHYSICAL EXAM   Temp:  [98.1 F (36.7 C)-99.7 F (37.6 C)] 98.2 F (36.8 C) (06/03 0800) Pulse Rate:  [43-104] 84 (06/03 1030) Resp:  [14-25] 15 (06/03 1030) BP: (86-154)/(43-103) 128/84 (06/03 1030) SpO2:  [98 %-100 %] 100 % (06/03 1030) FiO2 (%):  [40 %] 40 % (06/03  0759)  General - Well nourished, well developed, intubated off sedation.  Ophthalmologic - fundi not visualized due to noncooperation.  Cardiovascular - irregularly irregular heart rate and rhythm.  Neuro - intubated off sedation, eyes closed but briefly open with painful stimuli. She is able to follow all simple commands on the right hand and foot. With forced eye opening, eyes in right gaze position, not blinking to visual threat bilaterally consistently, doll's eyes sluggish, not tracking, PERRL. Corneal reflex weak bilaterally, gag and cough present. Breathing over the vent.  Facial symmetry not able to test due to ET tube.  Tongue protrusion not cooperative. right UE 3/5 spontaneously and RLE 2/5 with pain, but no movement of LUE or LLE. DTR 1+ and no babinski. Sensation, coordination and gait not tested.   ASSESSMENT/PLAN Ms. ANDRA BELOUS is a 86 y.o. female with history of AF not on AC d/t fall risk, HTN, HLD, CKD presenting with left facial droop, severe dysarthria and left sided paralysis. Not a tPA candidate d/t time window. Sent to IR w/ mid R M1 occlusion.  Stroke:  R MCA infarct s/p IR w/ TICI3 revascularization and hemorrhagic transformation, infarct embolic secondary to known AF not on AC  CT head R basal ganglia, internal capsule, anterior R temporal lobe infarct. R M1 subtle hypodensity, ? Thrombus. No hemorrhage. ASPECTS 6    CTA head distal R M1 occlusion w/ no flow proximal  R M2 and poorly seen distal R MCA branches  CTA neck B ICA bifurcation atherosclerosis. 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 Multifocal sclerotic lesions cervical and thoracic spine suspiciou for osseous metastatic dz. R>L pleural effusions.   CT perfusion R MCA 58mL core infarct. 122mL hypoperfusion. Mismatch 54mL.  Cerebral angio mid R M1 occlusion s/p mechanical thrombectomy w/ TICI3 revascularization   Post IR CT no hemorrhage    MRI  Evolving R MCA territory  infarct w/ possible hemorrhagic transformation R lentiform nucleus. R ICA siphon flow void, possible reocclusion.   CT head  Evolving R  basal ganglia infarct w/ R lentiform nucleus hemorrhagic transformation. Mild edema.   CT repeat 6/2 combination of HT and contrast staining at right BG and caudate. Similar edema and mass effect   2D Echo EF 55-60%   LDL 87  HgbA1c 6.6  SCDs for VTE prophylaxis  aspirin 81 mg daily prior to admission, now on No antithrombotic due to hemorrhagic conversion.  Therapy recommendations:  pending   Disposition:  pending   Acute Respiratory failure d/t encephalopathy, stroke  Intubated for IR, left intubated for airway protection  On sedation   CCM onboard   Mental status precludes extubation today  Family stated that pt would not want trach or PEG but hopeful for extubation  Atrial Fibrillation  Home anticoagulation:  none d/t fall risk   Mild RVR, resolved  Rate controlled now  Resume home cardizem -> d/c due to low BP  Resume metoprolol 25->12.5 bid   Hypertensive Urgency Mild hypotension   SBP 164/109 on arrival  Home meds:  Diltiazem 240, lasix 20, metoprolol 25, spironolactone 25   Treated with Cleviprex, now off  Stable at low end  D/c home cardizem  Decreased metoprolol 25->12.5 bid   Keep SBP < 160 given hemorrhagic transformation   Long-term BP goal normotensive  Hyperlipidemia  Home meds:  liptitor 20   LDL 87, goal < 70  Resume lipitor 20 - no high dose statin due to advanced age and relatively low LDL  Continue statin at discharge  Prediabetes vs Diabetes type II   Home meds:  None  HgbA1c 6.6, at goal < 7.0  SSI  CBG monitoring  PCP follow up  Dysphagia  Secondary to stroke  NPO  TF via [employer]  Gent[facility] IVF  Speech on board   Other Stroke Risk Factors  Advanced age  SSS  On estrace vaginal cream PTA  Other Active Problems  Glaucoma on timoptic   Osteopenia on fosamax  PTA  Hypothyroidism on synthroid   Hospital day # 2  Patient continues to be critically ill for the last 24 hours, has developed soft BP and bradycardia, continues to have lethargy and not able to extubate, and I have d/c cardizem and decreased metoproolo. Continued on ventilation and not candidate for extubation at this time. I had long discussion with son and daughter at bedside, updated pt current condition, treatment plan and potential prognosis, and answered all the questions. They expressed understanding and appreciation and would like to give pt more time to see if she improves.   This patient is critically ill due to right MCA stroke, hemorrhagic conversion, A. fib RVR, respiratory failure and at significant risk of neurological worsening, death form recurrent stroke, hematoma expansion, heart failure, cerebral edema, aspiration pneumonia and sepsis. This patient's care requires constant monitoring of vital signs, hemodynamics, respiratory and cardiac monitoring, review of multiple databases, neurological assessment, discussion with family, other specialists and medical decision making of high complexity. I spent 35 minutes of neurocritical care time in the care of this patient.   Rosalin Hawking, MD PhD Stroke Neurology 06/18/2019 11:10 AM   To contact Stroke Continuity provider, please refer to [email or web address] After hours, contact General Neurology

## 2019-06-18 NOTE — Evaluation (Signed)
Occupational Therapy Evaluation Patient Details Name: Danielle Malone MRN: [id number] DOB: 09-03-1925 Today's Date: 06/18/2019    History of Present Illness 86 yr old F w/ PMHx sig for Afib, Pulm HTN, CKD stage 3, SSS presented on 06/01 with left hemiplegia LSN 0600  found to have an acute ischemic stroke involving distal M1Right MCA. Was not a candidate for TPA. Pt underwent cerebral angiogram & mechanical thrombectomy of right M1 MCA with resolution of occlusive thrombus on 6/1. Pt remains ventilated after procedure.    Clinical Impression   Pt admitted with above. She demonstrates the below listed deficits and will benefit from continued OT to maximize safety and independence with BADLs.  Pt presents to OT with Lt hemiplegia, Impaired balance, decreased activity tolerance, impaired cognition.  She currently requires max - total A for ADLs and max A +2 for bed mobility.  Eval limited due to pt having large amount loose incontinent stool.  Pt lived with her son PTA, was was mod I with ADLs and IADLs including light gardening, and driving short distances.  Family is very supportive.  Recommend CIR level therapies to allow pt to maximize safety and independence with ADLs.       Follow Up Recommendations  CIR;Supervision/Assistance - 24 hour    Equipment Recommendations  None recommended by OT    Recommendations for Other Services Rehab consult     Precautions / Restrictions Precautions Precautions: Fall Restrictions Weight Bearing Restrictions: No      Mobility Bed Mobility Overal bed mobility: Needs Assistance Bed Mobility: Rolling;Supine to Sit;Sit to Supine Rolling: Max assist;+2 for physical assistance   Supine to sit: Total assist;+2 for physical assistance Sit to supine: Total assist;+2 for physical assistance   General bed mobility comments: pt is able to bend R knee to initiate log roll and can reach some with RUE, holds PT hand to assist in rolling  Transfers                 General transfer comment: unable     Balance Overall balance assessment: Needs assistance Sitting-balance support: Single extremity supported;Feet unsupported Sitting balance-Leahy Scale: Zero Sitting balance - Comments: maxA to maintain sitting balance at edge of bed Postural control: Posterior lean                                 ADL either performed or assessed with clinical judgement   ADL Overall ADL's : Needs assistance/impaired Eating/Feeding: NPO   Grooming: Wash/dry hands;Wash/dry face;Oral care;Brushing hair;Maximal assistance;Bed level   Upper Body Bathing: Total assistance;Bed level   Lower Body Bathing: Total assistance;Bed level   Upper Body Dressing : Total assistance;Bed level   Lower Body Dressing: Total assistance;Bed level   Toilet Transfer: Total assistance Toilet Transfer Details (indicate cue type and reason): unable to attempt  Toileting- Clothing Manipulation and Hygiene: Total assistance;Bed level Toileting - Clothing Manipulation Details (indicate cue type and reason): Pt incontinent large amount of loose stool. Therapists assisted with peri care      Functional mobility during ADLs: Total assistance;+2 for physical assistance;+2 for safety/equipment General ADL Comments: Eval limited due to large amount of incontinent stool      Vision   Additional Comments: difficult to accurately assess.  Pt keeps eyes closed majority of the time.  She will open them briefly when instructed to do so.      Perception Perception Perception Tested?: No   Praxis  Praxis Praxis tested?: Not tested    Pertinent Vitals/Pain Pain Assessment: Faces Faces Pain Scale: Hurts even more Pain Location: generalized Pain Descriptors / Indicators: Grimacing Pain Intervention(s): Monitored during session     Hand Dominance Right   Extremity/Trunk Assessment Upper Extremity Assessment Upper Extremity Assessment: LUE deficits/detail LUE  Deficits / Details: no actie movement noted.  PROM WFL with mild tightness in shoulder  LUE Coordination: decreased fine motor;decreased gross motor   Lower Extremity Assessment Lower Extremity Assessment: Defer to PT evaluation   Cervical / Trunk Assessment Cervical / Trunk Assessment: Kyphotic   Communication Communication Communication: Other (comment)(intubate)   Cognition Arousal/Alertness: Lethargic(sedation weaned) Behavior During Therapy: Flat affect Overall Cognitive Status: Difficult to assess                                 General Comments: pt follows ~50% one step commands for motor tasks with R side, no motor commands followed with left side. Pt often keeping eyes closed but does partially open eyes to command. Family present and providing history   General Comments  session limited by pt having runny bowel movement which was found upon sitting at the edge of the bed. PT/OT assist in cleaning patient and changing linens. Difficult to assess pt's vision as she maintains eyes closed for most of session and communication is limited due to intubation    Exercises     Shoulder Instructions      Home Living Family/patient expects to be discharged to:: Private residence Living Arrangements: Children Available Help at Discharge: Family;Available 24 hours/day Type of Home: House Home Access: Stairs to enter [employer] of Steps: 3 Entrance Stairs-Rails: Right;Left Home Layout: One level     Bathroom Shower/Tub: Teacher, early years/pre: Standard     Home Equipment: Environmental consultant - 2 wheels;Cane - single point;Shower seat;Grab bars - tub/shower          Prior Functioning/Environment Level of Independence: Independent        Comments: pt with intermittent use of cane or walker for out in yard        OT Problem List: Decreased strength;Decreased range of motion;Decreased activity tolerance;Impaired balance (sitting and/or  standing);Impaired vision/perception;Decreased coordination;Decreased cognition;Decreased safety awareness;Decreased knowledge of use of DME or AE;Cardiopulmonary status limiting activity;Impaired UE functional use;Pain      OT Treatment/Interventions:      OT Goals(Current goals can be found in the care plan section) Acute Rehab OT Goals Patient Stated Goal: to be able to return to gardening - per family  OT Goal Formulation: With family Time For Goal Achievement: 07/02/19 Potential to Achieve Goals: Good ADL Goals Pt Will Perform Grooming: with mod assist;sitting Pt Will Perform Upper Body Bathing: with mod assist;sitting Pt Will Transfer to Toilet: with mod assist;squat pivot transfer;bedside commode Additional ADL Goal #1: Pt will follow one step commands consistently during ADLs  OT Frequency:     Barriers to D/C:            Co-evaluation PT/OT/SLP Co-Evaluation/Treatment: Yes Reason for Co-Treatment: Complexity of the patient's impairments (multi-system involvement);Necessary to address cognition/behavior during functional activity;For patient/therapist safety;To address functional/ADL transfers   OT goals addressed during session: Strengthening/ROM;ADL's and self-care      AM-PAC OT "6 Clicks" Daily Activity     Outcome Measure Help from another person eating meals?: Total Help from another person taking care of personal grooming?: A Lot Help from another person  toileting, which includes using toliet, bedpan, or urinal?: Total Help from another person bathing (including washing, rinsing, drying)?: Total Help from another person to put on and taking off regular upper body clothing?: Total Help from another person to put on and taking off regular lower body clothing?: Total 6 Click Score: 7   End of Session Equipment Utilized During Treatment: Oxygen Nurse Communication: Mobility status  Activity Tolerance: Other (comment)(incontinent large amount loose stool ) Patient  left: in chair;with call bell/phone within reach;with bed alarm set;with family/visitor present;with SCD's reapplied  OT Visit Diagnosis: Hemiplegia and hemiparesis Hemiplegia - Right/Left: Left Hemiplegia - dominant/non-dominant: Non-Dominant                [id number] OT Time Calculation (min): 55 min Charges:  OT General Charges $OT Visit: 1 Visit OT Evaluation $OT Eval High Complexity: 1 High OT Treatments $Self Care/Home Management : 8-22 mins  Nilsa Nutting., OTR/L Acute Rehabilitation Services Pager [phone] Office [phone]   Lucille Passy M 06/18/2019, 10:46 PM

## 2019-06-18 NOTE — Progress Notes (Signed)
..   NAME:  Danielle Malone, MRN:  [id number], DOB:  05/08/1925, LOS: 2 ADMISSION DATE:  07/10/2019, CONSULTATION DATE:  07/11/2019 REFERRING MD:  Cheral Marker MD, CHIEF COMPLAINT:  Left hemiplegia s/p thrombectomy  Brief History   86 yr old F w/ PMHx sig for Afib, Pulm HTN, CKD stage 3, SSS presented on 06/01 with left hemiplegia LSN 0600  found to have an acute ischemic stroke involving distal M1Right MCA. Was not a candidate for TPA. POD 0 cerebral angiogram & mechanical thrombectomy of right M1 MCA with resolution of occlusive thrombus. MRI to follow.PCCM consulted for ventilator mgmt.   Past Medical History  Chronic atrial fibrillation Pulmonary HTN Essential HTN HLD CKD stage 3 O2 dependent at night Glaucoma Sacral fracture SSS  Home medications: ASA and Atorvastatin per Neurology documentation. None available on EMR at time of consult Significant Hospital Events   S/p mechanical thrombectomy  Consults:  Anesthesia IR PCCM  Procedures:  Endotracheally intubated POD 0 mechanical thrombectomy  Significant Diagnostic Tests:  07/06/2019 CT head: Acute ischemic infarction changes involving the right basal ganglia, internal capsule and anterior right temporal lobe. ASPECTS is 6. Subtle hyperdensity is questioned in the region of the distal M1 right MCA, which may reflect thrombus. Correlate with pending CTA Head/neck. No evidence of acute intracranial hemorrhage. Background mild generalized parenchymal atrophy and chronic small vessel ischemic disease.  CTA head/neck 6/1>>> CTA neck: 1. The common carotid, internal carotid and vertebral arteries are patent within the neck without hemodynamically significant stenosis. Mild atherosclerotic plaque within the carotid bifurcations bilaterally. 2. The left internal carotid artery appears to originate distal to the left subclavian takeoff. 3. 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 4. Multifocal sclerotic lesions within the cervical and visualized thoracic spine suspicious for osseous metastatic disease. 5. Partially imaged pleural effusions (greater on the right).  CTA head: Abrupt occlusion of the distal M1 right middle cerebral artery. There is non opacification of adjacent proximal M2 right MCA branch vessels. There is poor opacification of more distal right MCA branch vessels.  CT perfusion head: The perfusion software detects a 10 mL region of core infarction within the right MCA vascular territory, which is likely underestimated given findings on noncontrast head CT performed earlier the same day. The perfusion software detects a 107 mL region of hypoperfusion within the right MCA vascular territory utilizing a Tmax>6 seconds threshold. Reported mismatch volume: 97 mL.   ECHO on 04/27/19 Left ventricular systolic function is normal.  LV ejection fraction = 60-65%.  No segmental wall motion abnormalities seen in the left ventricle  Left ventricular filling pattern is indeterminate.  The right ventricle is mildly dilated.  The right ventricular systolic function is moderately reduced.  The left atrium is severely dilated.  The right atrium is severely dilated.  There is mild to moderate aortic stenosis.  There is moderate mitral regurgitation.   CT head 6/2>>> Evolving recent right MCA territory infarction. There is a combination of hemorrhagic transformation and contrast staining about the right lentiform nucleus and to a lesser extent along the caudate. Similar associated edema and mild mass effect. Micro Data:  07/12/2019: SARSCOV2--> negative MRSA PCR sent on 06/01--> pending at time of consult  Antimicrobials:  none   Interim history/subjective:  No sig change overnight  Following some commands on R side  Propofol off since 8am  tol PS 15/5  Some intermittent bradycardia per RN  Objective  Blood pressure (!) 154/86, pulse 94, temperature 98.2 F (36.8 C), temperature source Axillary, resp. rate (!) 21, weight 58.9 kg, SpO2 100 %.    Vent Mode: PSV;CPAP FiO2 (%):  [40 %] 40 % Set Rate:  [16 bmp] 16 bmp Vt Set:  [420 mL] 420 mL PEEP:  [5 cmH20] 5 cmH20 Pressure Support:  [5 cmH20-15 cmH20] 15 cmH20 Plateau Pressure:  [13 cmH20] 13 cmH20   Intake/Output Summary (Last 24 hours) at 06/18/2019 [id number] Last data filed at 06/18/2019 0800 Gross per 24 hour  Intake 2893.44 ml  Output 550 ml  Net 2343.44 ml   Filed Weights   07/09/2019 2100  Weight: 58.9 kg    Examination: General:  Frail elderly female, NAD  HEENT: MM pink/moist, ETT  Neuro: eyes closed, off propofol, follows commands with R side, does not open eyes  CV: s1s2 rrr, no m/r/g PULM:  resps even non labored on PS 15/5, scattered rhonchi  GI: soft, bsx4 active  Extremities: warm/dry, no edema  Skin: no rashes or lesions   Assessment & Plan:  1. Acute encephalopathy s/p ischemic CVA of Right M1 MCA s/p mechanical thrombectomy  PLAN -  Vent support prognosis for recovery guarded.  Continue current support and assess any improvement over next 24-48 hrs  Leave sedation off  Will add low dose PRN sedation   2. Acute Respiratory failure requiring mechanical ventilation secondary to encephalopathy Airway protection Vent support - 8cc/kg  F/u CXR  F/u ABG  Continue daily SBT but mental status precludes extubation.  If mental status improves may be able to consider one-way extubation once goals of care clearly established   3. BP control post mechanical thrombectomy Dilated cardiomyopathy with preserved LV fn EF 60-65%  with reduced RV fn and h/o Pulm HTN Also mild to moderate AS with moderate MR on last ECHO Has a h/o HTN and HLD PLAN -  Continue cardizem per tube  Will decrease metoprolol to 12.5 BID   Best practice:  Diet: NPO for now if remains intubated start TF Pain/Anxiety/Delirium protocol (if  indicated): on propofol gtt VAP protocol (if indicated): yes DVT prophylaxis: SCDs only GI prophylaxis: Protonix Glucose control: ISS goal BG 140-180 mg/dl Mobility: bedrest Code Status: Full Family Communication: Son updated at the bedside 6/2 and informed regarding guarded prognosis.  Disposition: ICU  Labs   CBC: Recent Labs  Lab 06/25/2019 1724 06/23/2019 1727 06/17/19 0342 06/18/19 0625  WBC 8.1  --   --  9.7  NEUTROABS 7.0  --   --   --   HGB 14.9 16.3* 12.9 12.3  HCT 45.6 48.0* 38.0 38.5  MCV 88.5  --   --  91.0  PLT 184  --   --  [id number]    Basic Metabolic Panel: Recent Labs  Lab 06/24/2019 1724 07/13/2019 1727 06/17/19 0342 06/17/19 1229 06/17/19 1635 06/18/19 0625  NA 132* 131* 134*  --   --  135  K 4.2 4.0 3.7  --   --  3.6  CL 96* 95*  --   --   --  104  CO2 21*  --   --   --   --  22  GLUCOSE 143* 141*  --   --   --  128*  BUN 14 16  --   --   --  19  CREATININE 0.78 0.40*  --   --   --  0.68  CALCIUM 8.9  --   --   --   --  7.6*  MG  --   --   --  1.7 1.7 1.9  PHOS  --   --   --  3.5 3.3 1.8*   GFR: CrCl cannot be calculated (Unknown ideal weight.). Recent Labs  Lab 07/05/2019 1724 06/29/2019 2135 06/17/19 0126 06/18/19 0625  WBC 8.1  --   --  9.7  LATICACIDVEN  --  2.3* 1.4  --     Liver Function Tests: Recent Labs  Lab 06/17/2019 1724  AST 22  ALT 17  ALKPHOS 57  BILITOT 1.7*  PROT 6.7  ALBUMIN 3.6   No results for input(s): LIPASE, AMYLASE in the last 168 hours. No results for input(s): AMMONIA in the last 168 hours.  ABG    Component Value Date/Time   PHART 7.458 (H) 06/17/2019 0342   PCO2ART 30.7 (L) 06/17/2019 0342   PO2ART 75 (L) 06/17/2019 0342   HCO3 21.9 06/17/2019 0342   TCO2 23 06/17/2019 0342   ACIDBASEDEF 1.0 06/17/2019 0342   O2SAT 96.0 06/17/2019 0342     Coagulation Profile: Recent Labs  Lab 07/13/2019 1724  INR 1.1    Cardiac Enzymes: No results for input(s): CKTOTAL, CKMB, CKMBINDEX, TROPONINI in the last 168  hours.  HbA1C: Hgb A1c MFr Bld  Date/Time Value Ref Range Status  06/17/2019 01:26 AM 6.6 (H) 4.8 - 5.6 % Final    Comment:    (NOTE) Pre diabetes:          5.7%-6.4% Diabetes:              >6.4% Glycemic control for   <7.0% adults with diabetes     CBG: Recent Labs  Lab 06/17/19 1547 06/17/19 1945 06/17/19 2337 06/18/19 0350 06/18/19 0821  GLUCAP 122* 126* 139* 108* 139*   06/18/2019 9:38 AM  CRITICAL CARE Performed by: Darlina Sicilian   Total critical care time: 32 minutes  Critical care time was exclusive of separately billable procedures and treating other patients.  Critical care was necessary to treat or prevent imminent or life-threatening deterioration.  Critical care was time spent personally by me on the following activities: development of treatment plan with patient and/or surrogate as well as nursing, discussions with consultants, evaluation of patient's response to treatment, examination of patient, obtaining history from patient or surrogate, ordering and performing treatments and interventions, ordering and review of laboratory studies, ordering and review of radiographic studies, pulse oximetry, re-evaluation of patient's condition and participation in multidisciplinary rounds.   Nickolas Madrid, NP Pulmonary/Critical Care Medicine  06/18/2019  9:38 AM

## 2019-06-18 NOTE — Evaluation (Signed)
Physical Therapy Evaluation Patient Details Name: Danielle Malone MRN: [id number] DOB: 10-21-1925 Today's Date: 06/18/2019   History of Present Illness  86 yr old F w/ PMHx sig for Afib, Pulm HTN, CKD stage 3, SSS presented on 06/01 with left hemiplegia LSN 0600  found to have an acute ischemic stroke involving distal M1Right MCA. Was not a candidate for TPA. Pt underwent cerebral angiogram & mechanical thrombectomy of right M1 MCA with resolution of occlusive thrombus on 6/1. Pt remains ventilated after procedure.   Clinical Impression  Pt presents to PT with deficits in functional mobility, gait, balance, endurance, strength, power, motor control, tone, and cardiopulmonary function. Assessment of cognition and communication is limited by intubation, however pt does follow one-step verbal commands throughout session after becoming more alert. Pt requires significant physical assistance to mobilize at this time due to L hemiplegia and additional R weakness. PT evaluation limited by patient having a large and runny bowel movement upon sitting at the edge of bed. Pt will benefit from continued acute PT services to further assess mobiltiy quality and to aide in reducing caregiver burden. PT recommending CIR at this time as pt does follow commands well and was mobilizing independently prior to admission.    Follow Up Recommendations CIR;Supervision/Assistance - 24 hour    Equipment Recommendations  Wheelchair (measurements PT);Wheelchair cushion (measurements PT);Hospital bed(mechanical lift)    Recommendations for Other Services Rehab consult     Precautions / Restrictions Precautions Precautions: Fall Restrictions Weight Bearing Restrictions: No      Mobility  Bed Mobility Overal bed mobility: Needs Assistance Bed Mobility: Rolling;Supine to Sit;Sit to Supine Rolling: Max assist;+2 for physical assistance   Supine to sit: Total assist;+2 for physical assistance Sit to supine: Total  assist;+2 for physical assistance   General bed mobility comments: pt is able to bend R knee to initiate log roll and can reach some with RUE, holds PT hand to assist in rolling  Transfers                    Ambulation/Gait                Stairs            Wheelchair Mobility    Modified Rankin (Stroke Patients Only) Modified Rankin (Stroke Patients Only) Pre-Morbid Rankin Score: No significant disability Modified Rankin: Severe disability     Balance Overall balance assessment: Needs assistance Sitting-balance support: Single extremity supported;Feet unsupported Sitting balance-Leahy Scale: Zero Sitting balance - Comments: maxA to maintain sitting balance at edge of bed Postural control: Posterior lean                                   Pertinent Vitals/Pain Pain Assessment: Faces Faces Pain Scale: Hurts even more Pain Location: generalized Pain Descriptors / Indicators: Grimacing Pain Intervention(s): Monitored during session    Home Living Family/patient expects to be discharged to:: Private residence Living Arrangements: Children Available Help at Discharge: Family;Available 24 hours/day Type of Home: House Home Access: Stairs to enter Entrance Stairs-Rails: Psychiatric nurse of Steps: 3 Home Layout: One level Home Equipment: Walker - 2 wheels;Cane - single point;Shower seat;Grab bars - tub/shower      Prior Function Level of Independence: Independent         Comments: pt with intermittent use of cane or walker for out in yard     Hand Dominance  Extremity/Trunk Assessment   Upper Extremity Assessment Upper Extremity Assessment: Defer to OT evaluation    Lower Extremity Assessment Lower Extremity Assessment: RLE deficits/detail;LLE deficits/detail RLE Deficits / Details: grossly 3+/5 based on observed mobility LLE Deficits / Details: No AROM noted in LLE during session, increased tone in  adductors and knee flexors during PROM noted but PROM WFL    Cervical / Trunk Assessment Cervical / Trunk Assessment: Kyphotic  Communication   Communication: Other (comment)(intubate)  Cognition Arousal/Alertness: Lethargic(sedation weaned) Behavior During Therapy: Flat affect Overall Cognitive Status: Difficult to assess                                 General Comments: pt follows ~50% one step commands for motor tasks with R side, no motor commands followed with left side. Pt often keeping eyes closed but does partially open eyes to command. Family present and providing history      General Comments General comments (skin integrity, edema, etc.): session limited by pt having runny bowel movement which was found upon sitting at the edge of the bed. PT/OT assist in cleaning patient and changing linens. Difficult to assess pt's vision as she maintains eyes closed for most of session and communication is limited due to intubation    Exercises     Assessment/Plan    PT Assessment Patient needs continued PT services  PT Problem List Decreased strength;Decreased activity tolerance;Decreased balance;Decreased mobility;Decreased coordination;Decreased cognition;Decreased knowledge of use of DME;Decreased safety awareness;Decreased knowledge of precautions;Cardiopulmonary status limiting activity;Impaired tone       PT Treatment Interventions DME instruction;Gait training;Stair training;Functional mobility training;Therapeutic activities;Therapeutic exercise;Balance training;Neuromuscular re-education;Cognitive remediation;Patient/family education;Wheelchair mobility training    PT Goals (Current goals can be found in the Care Plan section)  Acute Rehab PT Goals Patient Stated Goal: To improve mobility PT Goal Formulation: With family Time For Goal Achievement: 07/02/19 Potential to Achieve Goals: Fair    Frequency Min 4X/week   Barriers to discharge         Co-evaluation PT/OT/SLP Co-Evaluation/Treatment: Yes Reason for Co-Treatment: Complexity of the patient's impairments (multi-system involvement);Necessary to address cognition/behavior during functional activity;For patient/therapist safety;To address functional/ADL transfers PT goals addressed during session: Mobility/safety with mobility;Balance;Strengthening/ROM         AM-PAC PT "6 Clicks" Mobility  Outcome Measure Help needed turning from your back to your side while in a flat bed without using bedrails?: Total Help needed moving from lying on your back to sitting on the side of a flat bed without using bedrails?: Total Help needed moving to and from a bed to a chair (including a wheelchair)?: Total Help needed standing up from a chair using your arms (e.g., wheelchair or bedside chair)?: Total Help needed to walk in hospital room?: Total Help needed climbing 3-5 steps with a railing? : Total 6 Click Score: 6    End of Session   Activity Tolerance: Patient tolerated treatment well Patient left: in bed;with call bell/phone within reach;with bed alarm set;with nursing/sitter in room;with family/visitor present Nurse Communication: Mobility status;Need for lift equipment PT Visit Diagnosis: Other abnormalities of gait and mobility (R26.89);Muscle weakness (generalized) (M62.81);History of falling (Z91.81);Hemiplegia and hemiparesis Hemiplegia - Right/Left: Left Hemiplegia - dominant/non-dominant: Non-dominant Hemiplegia - caused by: Cerebral infarction    Time: 1130-1213 PT Time Calculation (min) (ACUTE ONLY): 43 min   Charges:   PT Evaluation $PT Eval Moderate Complexity: 1 Mod  Zenaida Niece, PT, DPT Acute Rehabilitation Pager: [phone]   Zenaida Niece 06/18/2019, 1:47 PM

## 2019-06-19 ENCOUNTER — Inpatient Hospital Stay (HOSPITAL_COMMUNITY): Payer: Medicare Other

## 2019-06-19 DIAGNOSIS — J9601 Acute respiratory failure with hypoxia: Secondary | ICD-10-CM

## 2019-06-19 DIAGNOSIS — I482 Chronic atrial fibrillation, unspecified: Secondary | ICD-10-CM

## 2019-06-19 LAB — CBC
HCT: 38.9 % (ref 36.0–46.0)
Hemoglobin: 12.4 g/dL (ref 12.0–15.0)
MCH: 28.8 pg (ref 26.0–34.0)
MCHC: 31.9 g/dL (ref 30.0–36.0)
MCV: 90.5 fL (ref 80.0–100.0)
Platelets: 136 10*3/uL — ABNORMAL LOW (ref 150–400)
RBC: 4.3 MIL/uL (ref 3.87–5.11)
RDW: 14.3 % (ref 11.5–15.5)
WBC: 9.8 10*3/uL (ref 4.0–10.5)
nRBC: 0 % (ref 0.0–0.2)

## 2019-06-19 LAB — BASIC METABOLIC PANEL
Anion gap: 8 (ref 5–15)
BUN: 11 mg/dL (ref 8–23)
CO2: 22 mmol/L (ref 22–32)
Calcium: 7.5 mg/dL — ABNORMAL LOW (ref 8.9–10.3)
Chloride: 105 mmol/L (ref 98–111)
Creatinine, Ser: 0.5 mg/dL (ref 0.44–1.00)
GFR calc Af Amer: >60 mL/min (ref >60)
GFR calc non Af Amer: >60 mL/min (ref >60)
Glucose, Bld: 119 mg/dL — ABNORMAL HIGH (ref 70–99)
Potassium: 3.5 mmol/L (ref 3.5–5.1)
Sodium: 135 mmol/L (ref 135–145)

## 2019-06-19 LAB — TRIGLYCERIDES: Triglycerides: 34 mg/dL (ref <150)

## 2019-06-19 LAB — GLUCOSE, CAPILLARY
Glucose-Capillary: 101 mg/dL — ABNORMAL HIGH (ref 70–99)
Glucose-Capillary: 119 mg/dL — ABNORMAL HIGH (ref 70–99)
Glucose-Capillary: 128 mg/dL — ABNORMAL HIGH (ref 70–99)
Glucose-Capillary: 128 mg/dL — ABNORMAL HIGH (ref 70–99)
Glucose-Capillary: 137 mg/dL — ABNORMAL HIGH (ref 70–99)
Glucose-Capillary: 146 mg/dL — ABNORMAL HIGH (ref 70–99)

## 2019-06-19 MED ORDER — METOPROLOL TARTRATE 25 MG PO TABS
25.0000 mg | ORAL_TABLET | Freq: Two times a day (BID) | ORAL | Status: DC
  Administered 2019-06-19 – 2019-06-21 (×5): 25 mg
  Filled 2019-06-19 (×5): qty 1

## 2019-06-19 MED ORDER — DILTIAZEM HCL 60 MG PO TABS
60.0000 mg | ORAL_TABLET | Freq: Four times a day (QID) | ORAL | Status: DC
  Filled 2019-06-19 (×4): qty 1

## 2019-06-19 NOTE — Progress Notes (Addendum)
NAME:  Danielle Malone, MRN:  [id number], DOB:  [birth year], LOS: 3 ADMISSION DATE:  06/17/2019, CONSULTATION DATE:  07/13/2019 REFERRING MD:  Cheral Marker MD, CHIEF COMPLAINT:  Left hemiplegia s/p thrombectomy  Brief History   86 yr old F w/ PMHx sig for Afib, Pulm HTN, CKD stage 3, SSS presented on 06/01 with left hemiplegia LSN 0600 found to have an acute ischemic stroke involving distal M1Right MCA. Was not a candidate for TPA. POD 0 cerebral angiogram & mechanical thrombectomy of right M1 MCA with resolution of occlusive thrombus. MRI to follow. PCCM consulted for ventilator mgmt.  Past Medical History  Chronic atrial fibrillation Pulmonary HTN Essential HTN HLD CKD stage 3 O2 dependent at night Glaucoma Sacral fracture SSS  Home medications: ASA and Atorvastatin per Neurology documentation. None available on EMR at time of consult  [facility] Events   6/01 Admit with left sided weakness, s/p mechanical thrombectomy 6/04 On vent, PSV wean.  Mental status prohibits extubation   Consults:  Anesthesia IR PCCM  Procedures:  Endotracheally intubated POD 0 mechanical thrombectomy  Significant Diagnostic Tests:  06/29/2019 CT head: Acute ischemic infarction changes involving the right basal ganglia, internal capsule and anterior right temporal lobe. ASPECTS is 6. Subtle hyperdensity is questioned in the region of the distal M1 right MCA, which may reflect thrombus. Correlate with pending CTA Head/neck. No evidence of acute intracranial hemorrhage. Background mild generalized parenchymal atrophy and chronic small vessel ischemic disease.  CTA head/neck 6/1>>> CTA neck: 1. The common carotid, internal carotid and vertebral arteries are patent within the neck without hemodynamically significant stenosis. Mild atherosclerotic plaque within the carotid bifurcations bilaterally. 2. The left internal carotid artery appears to originate distal to the left subclavian  takeoff. 3. 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 4. Multifocal sclerotic lesions within the cervical and visualized thoracic spine suspicious for osseous metastatic disease. 5. Partially imaged pleural effusions (greater on the right).  CTA head: Abrupt occlusion of the distal M1 right middle cerebral artery. There is non opacification of adjacent proximal M2 right MCA branch vessels. There is poor opacification of more distal right MCA branch vessels.  CT perfusion head: The perfusion software detects a 10 mL region of core infarction within the right MCA vascular territory, which is likely underestimated given findings on noncontrast head CT performed earlier the same day. The perfusion software detects a 107 mL region of hypoperfusion within the right MCA vascular territory utilizing a Tmax>6 seconds threshold. Reported mismatch volume: 97 mL.   ECHO on 04/27/19 Left ventricular systolic function is normal.  LV ejection fraction = 60-65%.  No segmental wall motion abnormalities seen in the left ventricle  Left ventricular filling pattern is indeterminate.  The right ventricle is mildly dilated.  The right ventricular systolic function is moderately reduced.  The left atrium is severely dilated.  The right atrium is severely dilated.  There is mild to moderate aortic stenosis.  There is moderate mitral regurgitation.   CT head 6/2>>> Evolving recent right MCA territory infarction. There is a combination of hemorrhagic transformation and contrast staining about the right lentiform nucleus and to a lesser extent along the caudate. Similar associated edema and mild mass effect.  Micro Data:  06/23/2019: SARSCOV2--> negative MRSA PCR sent on 06/01--> pending at time of consult  Antimicrobials:  none   Interim history/subjective:  No changes overnight  On PSV wean  Off sedation >  last dose of IV fentanyl 0147 74mcg overnight  Objective   Blood pressure 138/80, pulse 82, temperature (!) 97.3 F (36.3 C), temperature source Axillary, resp. rate 19, weight 61.4 kg, SpO2 100 %.    Vent Mode: PSV;CPAP FiO2 (%):  [40 %] 40 % Set Rate:  [16 bmp] 16 bmp Vt Set:  [420 mL] 420 mL PEEP:  [5 cmH20] 5 cmH20 Pressure Support:  [8 cmH20-15 cmH20] 8 cmH20 Plateau Pressure:  [16 cmH20-18 cmH20] 18 cmH20   Intake/Output Summary (Last 24 hours) at 06/19/2019 1117 Last data filed at 06/19/2019 1000 Gross per 24 hour  Intake 3058.19 ml  Output 1050 ml  Net 2008.19 ml   Filed Weights   06/24/2019 2100 06/19/19 0443  Weight: 58.9 kg 61.4 kg    Examination: General: elderly frail female lying in bed on vent in NAD HEENT: MM pink/moist, ETT, head turned to the right  Neuro: eyes closed, opens to voice, follows commands on right side. Unable to lift head off bed.  CV: s1s2 rrr, SEM 2-3/6 PULM:  Non-labored on vent, lungs bilaterally clear  GI: soft, bsx4 active  Extremities: warm/dry, no edema  Skin: no rashes or lesions, bruising noted on arms    Assessment & Plan:   Acute encephalopathy s/p ischemic CVA of Right M1 MCA s/p mechanical thrombectomy   -post CVA care per neurology  -concern for guarded prognosis for recovery  -monitor off sedation   Acute Respiratory failure requiring mechanical ventilation secondary to encephalopathy Airway protection -PRVC 8cc as rest mode -daily SBT as tolerated  -follow intermittent CXR  -concern she will not be able to protect airway given overall weakness   -PAD protocol with PRN low dose fentanyl / versed  BP control post mechanical thrombectomy Dilated cardiomyopathy with preserved LV fn EF 60-65% with reduced RV fn and h/o Pulm HTN Mild to moderate AS with moderate MR on last ECHO HTN, HLD -continue cardizem PT  -continue lopressor 12.5 BID   Best practice:  Diet: TF Pain/Anxiety/Delirium protocol (if indicated): minimize  sedation  VAP protocol (if indicated): yes DVT prophylaxis: SCDs only GI prophylaxis: Protonix Glucose control: SSI goal BG 140-180 mg/dl Mobility: bedrest Code Status: Full Family Communication: Son and daughter updated at bedside. Reviewed current situation, vent mechanics and concern for her ability to protect her airway post extubation.  We discussed what her wishes would be if she fails extubation and family indicates they do not think she would want to be reintubated or long term support.  I asked them to talk about it as a family and let the team know regarding concept of reintubation.   Disposition: ICU  Labs   CBC: Recent Labs  Lab 07/05/2019 1724 07/03/2019 1727 06/17/19 0342 06/18/19 0625 06/19/19 0700  WBC 8.1  --   --  9.7 9.8  NEUTROABS 7.0  --   --   --   --   HGB 14.9 16.3* 12.9 12.3 12.4  HCT 45.6 48.0* 38.0 38.5 38.9  MCV 88.5  --   --  91.0 90.5  PLT 184  --   --  150 136*    Basic Metabolic Panel: Recent Labs  Lab 07/13/2019 1724 07/12/2019 1727 06/17/19 0342 06/17/19 1229 06/17/19 1635 06/18/19 0625 06/18/19 1829 06/19/19 0700  NA 132* 131* 134*  --   --  135  --  135  K 4.2 4.0 3.7  --   --  3.6  --  3.5  CL 96* 95*  --   --   --  104  --  105  CO2 21*  --   --   --   --  22  --  22  GLUCOSE 143* 141*  --   --   --  128*  --  119*  BUN 14 16  --   --   --  19  --  11  CREATININE 0.78 0.40*  --   --   --  0.68  --  0.50  CALCIUM 8.9  --   --   --   --  7.6*  --  7.5*  MG  --   --   --  1.7 1.7 1.9 1.7  --   PHOS  --   --   --  3.5 3.3 1.8* 2.4*  --    GFR: CrCl cannot be calculated (Unknown ideal weight.). Recent Labs  Lab 06/29/2019 1724 07/10/2019 2135 06/17/19 0126 06/18/19 0625 06/19/19 0700  WBC 8.1  --   --  9.7 9.8  LATICACIDVEN  --  2.3* 1.4  --   --     Liver Function Tests: Recent Labs  Lab 06/17/2019 1724  AST 22  ALT 17  ALKPHOS 57  BILITOT 1.7*  PROT 6.7  ALBUMIN 3.6   No results for input(s): LIPASE, AMYLASE in the last 168  hours. No results for input(s): AMMONIA in the last 168 hours.  ABG    Component Value Date/Time   PHART 7.458 (H) 06/17/2019 0342   PCO2ART 30.7 (L) 06/17/2019 0342   PO2ART 75 (L) 06/17/2019 0342   HCO3 21.9 06/17/2019 0342   TCO2 23 06/17/2019 0342   ACIDBASEDEF 1.0 06/17/2019 0342   O2SAT 96.0 06/17/2019 0342     Coagulation Profile: Recent Labs  Lab 07/15/2019 1724  INR 1.1    Cardiac Enzymes: No results for input(s): CKTOTAL, CKMB, CKMBINDEX, TROPONINI in the last 168 hours.  HbA1C: Hgb A1c MFr Bld  Date/Time Value Ref Range Status  06/17/2019 01:26 AM 6.6 (H) 4.8 - 5.6 % Final    Comment:    (NOTE) Pre diabetes:          5.7%-6.4% Diabetes:              >6.4% Glycemic control for   <7.0% adults with diabetes     CBG: Recent Labs  Lab 06/18/19 1614 06/18/19 1936 06/18/19 2331 06/19/19 0319 06/19/19 0847  GLUCAP 157* 77 111* 101* 137*   06/19/2019 11:17 AM   CC Time: 37 minutes   Noe Gens, MSN, NP-C [facility] 06/19/2019, 11:17 AM   Please see [email or web address] for pager details.

## 2019-06-19 NOTE — Progress Notes (Signed)
PT Cancellation Note  Patient Details Name: Danielle Malone MRN: [id number] DOB: September 02, 1925   Cancelled Treatment:    Reason Eval/Treat Not Completed: Patient not medically ready. Per discussion with RN pt on bedrest due to LE hematoma. PT will hold until pt is medically ready to participate in mobility and PT intervention.   Zenaida Niece 06/19/2019, 12:06 PM

## 2019-06-19 NOTE — Progress Notes (Signed)
STROKE TEAM PROGRESS NOTE   INTERVAL HISTORY Daughter and son and RN are at bedside. Pt still intubated and neuro stable, still follow commands on the right side, lethargic.  Respiratory wise has some improvement, but not candidate for extubation today.  CCM on board, will extubate as able.     Vitals:   06/19/19 1600 06/19/19 1700 06/19/19 1800 06/19/19 1900  BP: (!) 155/83 (!) 140/91 (!) 154/94 (!) 159/87  Pulse: 86 75 97 99  Resp: 19 15 (!) 21 15  Temp: 98.6 F (37 C)     TempSrc: Axillary     SpO2: 100% 100% 99% 98%  Weight:       CBC:  Recent Labs  Lab 06/25/2019 1724 07/04/2019 1727 06/18/19 0625 06/19/19 0700  WBC 8.1   < > 9.7 9.8  NEUTROABS 7.0  --   --   --   HGB 14.9   < > 12.3 12.4  HCT 45.6   < > 38.5 38.9  MCV 88.5   < > 91.0 90.5  PLT 184   < > 150 136*   < > = values in this interval not displayed.   Basic Metabolic Panel:  Recent Labs  Lab 06/18/19 0625 06/18/19 1829 06/19/19 0700  NA 135  --  135  K 3.6  --  3.5  CL 104  --  105  CO2 22  --  22  GLUCOSE 128*  --  119*  BUN 19  --  11  CREATININE 0.68  --  0.50  CALCIUM 7.6*  --  7.5*  MG 1.9 1.7  --   PHOS 1.8* 2.4*  --    Lipid Panel:     Component Value Date/Time   CHOL 174 06/17/2019 0126   TRIG 34 06/19/2019 0700   HDL 73 06/17/2019 0126   CHOLHDL 2.4 06/17/2019 0126   VLDL 14 06/17/2019 0126   LDLCALC 87 06/17/2019 0126   HgbA1c:  Lab Results  Component Value Date   HGBA1C 6.6 (H) 06/17/2019   Urine Drug Screen:     Component Value Date/Time   LABOPIA NONE DETECTED 07/05/2019 1955   COCAINSCRNUR NONE DETECTED 07/11/2019 1955   LABBENZ NONE DETECTED 06/29/2019 1955   AMPHETMU NONE DETECTED 06/27/2019 1955   THCU NONE DETECTED 07/07/2019 1955   LABBARB NONE DETECTED 07/05/2019 1955    Alcohol Level     Component Value Date/Time   ETH <10 06/30/2019 1724    IMAGING past 24 hours DG Chest Port 1 View  Result Date: 06/19/2019 CLINICAL DATA:  Stroke EXAM: PORTABLE CHEST 1  VIEW COMPARISON:  07/15/2019 FINDINGS: Endotracheal tube terminates 4.1 cm above the carina. Enteric tube courses below the diaphragm with distal tip beyond the inferior margin of the film. Stable cardiomegaly. Atherosclerotic calcification of the aortic knob. Persistent bilateral interstitial prominence with small bilateral pleural effusions, similar to prior. Osseous structures are demineralized. IMPRESSION: Stable appearance of the chest.  Support apparatus, as above. Electronically Signed   By: Davina Poke D.O.   On: 06/19/2019 08:50    PHYSICAL EXAM   Temp:  [97.3 F (36.3 C)-101.2 F (38.4 C)] 98.6 F (37 C) (06/04 1600) Pulse Rate:  [63-121] 99 (06/04 1900) Resp:  [13-21] 15 (06/04 1900) BP: (125-163)/(76-95) 159/87 (06/04 1900) SpO2:  [98 %-100 %] 98 % (06/04 1900) FiO2 (%):  [40 %] 40 % (06/04 1451) Weight:  [61.4 kg] 61.4 kg (06/04 0443)  General - Well nourished, well developed, intubated off sedation.  Ophthalmologic - fundi not visualized due to noncooperation.  Cardiovascular - irregularly irregular heart rate and rhythm.  Neuro - intubated off sedation, eyes closed but briefly open with voice. She is able to follow all simple commands on the right hand and foot. With forced eye opening, eyes right gaze preference, but able to have left gaze with prompt, not blinking to visual threat bilaterally consistently, doll's eyes sluggish, not tracking, PERRL. Corneal reflex present bilaterally, gag and cough present. Breathing over the vent.  Facial symmetry not able to test due to ET tube.  Tongue protrusion not cooperative. right UE 3/5 spontaneously and RLE 2+/5 with pain, but no movement of LUE, mild withdrawal LLE. DTR 1+ and no babinski. Sensation, coordination and gait not tested.   ASSESSMENT/PLAN Danielle Malone is a 86 y.o. female with history of AF not on AC d/t fall risk, HTN, HLD, CKD presenting with left facial droop, severe dysarthria and left sided paralysis.  Not a tPA candidate d/t time window. Sent to IR w/ mid R M1 occlusion.  Stroke:  R MCA infarct s/p IR w/ TICI3 revascularization and hemorrhagic transformation, infarct embolic secondary to known AF not on AC  CT head R basal ganglia, internal capsule, anterior R temporal lobe infarct. R M1 subtle hypodensity, ? Thrombus. No hemorrhage. ASPECTS 6    CTA head distal R M1 occlusion w/ no flow proximal  R M2 and poorly seen distal R MCA branches  CTA neck B ICA bifurcation atherosclerosis. Distal cervical R VA at C1 w/ probable 84mm calcified aneurysm. Multifocal sclerotic lesions cervical and thoracic spine suspiciou for osseous metastatic dz. R>L pleural effusions.   CT perfusion R MCA 33mL core infarct. 17mL hypoperfusion. Mismatch 16mL.  Cerebral angio mid R M1 occlusion s/p mechanical thrombectomy w/ TICI3 revascularization   Post IR CT no hemorrhage    MRI  Evolving R MCA territory infarct w/ possible hemorrhagic transformation R lentiform nucleus. R ICA siphon flow void, possible reocclusion.   CT head  Evolving R basal ganglia infarct w/ R lentiform nucleus hemorrhagic transformation. Mild edema.   CT repeat 6/2 combination of HT and contrast staining at right BG and caudate. Similar edema and mass effect   CT repeat in am  2D Echo EF 55-60%   LDL 87  HgbA1c 6.6  SCDs for VTE prophylaxis  aspirin 81 mg daily prior to admission, now on No antithrombotic due to hemorrhagic conversion. If repeat CT stable, may consider restart ASA.   Therapy recommendations:  pending   Disposition:  pending   Acute Respiratory failure d/t encephalopathy, stroke  Intubated for IR, left intubated for airway protection  Off sedation   CCM onboard   Extubate as able  Mental status precludes extubation today  Family stated that pt would not want trach or PEG but hopeful for extubation  Atrial Fibrillation  Home anticoagulation:  none d/t fall risk   Mild RVR, resolved  Rate  controlled now -> 90s  Resume home cardizem   Resume metoprolol 25->12.5->25 bid   Hypertensive Urgency Mild hypotension   SBP 164/109 on arrival  Home meds:  Diltiazem 240, lasix 20, metoprolol 25, spironolactone 25   Treated with Cleviprex, now off  Stable at low end  Resume home cardizem  Decreased metoprolol 25->12.5->25 bid  . Keep SBP < 160 given hemorrhagic transformation  . Long-term BP goal normotensive  Hyperlipidemia  Home meds:  liptitor 20   LDL 87, goal < 70  Resume lipitor 20 -  no high dose statin due to advanced age and relatively low LDL  Continue statin at discharge  Prediabetes vs Diabetes type II   Home meds:  None  HgbA1c 6.6, at goal < 7.0  SSI  CBG monitoring  PCP follow up  Dysphagia . Secondary to stroke . NPO . TF via [employer] @45  . Gentle IVF @40  . Speech on board   Other Stroke Risk Factors  Advanced age  SSS  On estrace vaginal cream PTA  Other Active Problems  Glaucoma on timoptic   Osteopenia on fosamax PTA  Hypothyroidism on synthroid   Hospital day # 3  Patient continues to be critically ill for the last 24 hours, has developed elevated BP and tachycardia, continues to have lethargy and not able to extubate, and I have resumed cardizem and increased metoproolo, and also ordered CT repeat in am in order to restart antithrombotics. Continued on ventilation and not candidate for extubation at this time. I had long discussion with son and daughter at bedside, updated pt current condition, treatment plan and potential prognosis, and answered all the questions.    This patient is critically ill due to right MCA stroke, hemorrhagic conversion, A. fib RVR, respiratory failure and at significant risk of neurological worsening, death form recurrent stroke, hematoma expansion, heart failure, cerebral edema, aspiration pneumonia and sepsis. This patient's care requires constant monitoring of vital signs, hemodynamics,  respiratory and cardiac monitoring, review of multiple databases, neurological assessment, discussion with family, other specialists and medical decision making of high complexity. I spent 35 minutes of neurocritical care time in the care of this patient.   Rosalin Hawking, MD PhD Stroke Neurology 06/19/2019 7:47 PM   To contact Stroke Continuity provider, please refer to [email or web address] After hours, contact General Neurology

## 2019-06-20 ENCOUNTER — Inpatient Hospital Stay (HOSPITAL_COMMUNITY): Payer: Medicare Other

## 2019-06-20 DIAGNOSIS — I6389 Other cerebral infarction: Secondary | ICD-10-CM

## 2019-06-20 DIAGNOSIS — I10 Essential (primary) hypertension: Secondary | ICD-10-CM

## 2019-06-20 DIAGNOSIS — I4891 Unspecified atrial fibrillation: Secondary | ICD-10-CM

## 2019-06-20 DIAGNOSIS — E43 Unspecified severe protein-calorie malnutrition: Secondary | ICD-10-CM

## 2019-06-20 LAB — GLUCOSE, CAPILLARY
Glucose-Capillary: 140 mg/dL — ABNORMAL HIGH (ref 70–99)
Glucose-Capillary: 132 mg/dL — ABNORMAL HIGH (ref 70–99)
Glucose-Capillary: 136 mg/dL — ABNORMAL HIGH (ref 70–99)
Glucose-Capillary: 89 mg/dL (ref 70–99)
Glucose-Capillary: 101 mg/dL — ABNORMAL HIGH (ref 70–99)
Glucose-Capillary: 135 mg/dL — ABNORMAL HIGH (ref 70–99)

## 2019-06-20 LAB — POCT I-STAT 7, (LYTES, BLD GAS, ICA,H+H)
Acid-Base Excess: 2 mmol/L (ref 0.0–2.0)
Bicarbonate: 26 mmol/L (ref 20.0–28.0)
Calcium, Ion: 1.16 mmol/L (ref 1.15–1.40)
HCT: 37 % (ref 36.0–46.0)
Hemoglobin: 12.6 g/dL (ref 12.0–15.0)
O2 Saturation: 99 %
Potassium: 3.7 mmol/L (ref 3.5–5.1)
Sodium: 133 mmol/L — ABNORMAL LOW (ref 135–145)
TCO2: 27 mmol/L (ref 22–32)
pCO2 arterial: 39.5 mmHg (ref 32.0–48.0)
pH, Arterial: 7.427 (ref 7.350–7.450)
pO2, Arterial: 154 mmHg — ABNORMAL HIGH (ref 83.0–108.0)

## 2019-06-20 LAB — CBC
HCT: 38.7 % (ref 36.0–46.0)
Hemoglobin: 12.6 g/dL (ref 12.0–15.0)
MCH: 28.9 pg (ref 26.0–34.0)
MCHC: 32.6 g/dL (ref 30.0–36.0)
MCV: 88.8 fL (ref 80.0–100.0)
Platelets: 126 10*3/uL — ABNORMAL LOW (ref 150–400)
RBC: 4.36 MIL/uL (ref 3.87–5.11)
RDW: 14.3 % (ref 11.5–15.5)
WBC: 11.2 10*3/uL — ABNORMAL HIGH (ref 4.0–10.5)
nRBC: 0 % (ref 0.0–0.2)

## 2019-06-20 LAB — BASIC METABOLIC PANEL
Anion gap: 10 (ref 5–15)
BUN: 15 mg/dL (ref 8–23)
CO2: 22 mmol/L (ref 22–32)
Calcium: 7.6 mg/dL — ABNORMAL LOW (ref 8.9–10.3)
Chloride: 102 mmol/L (ref 98–111)
Creatinine, Ser: 0.45 mg/dL (ref 0.44–1.00)
GFR calc Af Amer: >60 mL/min (ref >60)
GFR calc non Af Amer: >60 mL/min (ref >60)
Glucose, Bld: 137 mg/dL — ABNORMAL HIGH (ref 70–99)
Potassium: 3.9 mmol/L (ref 3.5–5.1)
Sodium: 134 mmol/L — ABNORMAL LOW (ref 135–145)

## 2019-06-20 MED ORDER — DILTIAZEM HCL 60 MG PO TABS
60.0000 mg | ORAL_TABLET | Freq: Four times a day (QID) | ORAL | Status: DC
  Administered 2019-06-20 – 2019-06-21 (×5): 60 mg
  Filled 2019-06-20 (×6): qty 1

## 2019-06-20 NOTE — Progress Notes (Addendum)
STROKE TEAM PROGRESS NOTE   INTERVAL HISTORY  Pt RN at bedside. Pt still on vent, as per RN, pt did not wean too long yesterday, now on vent, will try to wean again today. Neuro stable, still following commands on the right briskly. Vital and lab stable.    Vitals:   06/20/19 0500 06/20/19 0600 06/20/19 0619 06/20/19 0700  BP: 120/87 116/80 116/80 (!) 147/88  Pulse: (!) 108 77  81  Resp: 16 16  18   Temp:      TempSrc:      SpO2: 99% 100%  100%  Weight: 61 kg      CBC:  Recent Labs  Lab 07/04/2019 1724 06/23/2019 1727 06/18/19 0625 06/19/19 0700  WBC 8.1   < > 9.7 9.8  NEUTROABS 7.0  --   --   --   HGB 14.9   < > 12.3 12.4  HCT 45.6   < > 38.5 38.9  MCV 88.5   < > 91.0 90.5  PLT 184   < > 150 136*   < > = values in this interval not displayed.   Basic Metabolic Panel:  Recent Labs  Lab 06/18/19 0625 06/18/19 1829 06/19/19 0700  NA 135  --  135  K 3.6  --  3.5  CL 104  --  105  CO2 22  --  22  GLUCOSE 128*  --  119*  BUN 19  --  11  CREATININE 0.68  --  0.50  CALCIUM 7.6*  --  7.5*  MG 1.9 1.7  --   PHOS 1.8* 2.4*  --    Lipid Panel:     Component Value Date/Time   CHOL 174 06/17/2019 0126   TRIG 34 06/19/2019 0700   HDL 73 06/17/2019 0126   CHOLHDL 2.4 06/17/2019 0126   VLDL 14 06/17/2019 0126   LDLCALC 87 06/17/2019 0126   HgbA1c:  Lab Results  Component Value Date   HGBA1C 6.6 (H) 06/17/2019   Urine Drug Screen:     Component Value Date/Time   LABOPIA NONE DETECTED 07/09/2019 1955   COCAINSCRNUR NONE DETECTED 06/21/2019 1955   LABBENZ NONE DETECTED 06/21/2019 1955   AMPHETMU NONE DETECTED 07/12/2019 1955   THCU NONE DETECTED 07/13/2019 1955   LABBARB NONE DETECTED 06/29/2019 1955    Alcohol Level     Component Value Date/Time   ETH <10 06/25/2019 1724    IMAGING past 24 hours  CT HEAD WO CONTRAST 06/20/2019 IMPRESSION:  1. Normal expected interval evolution of subacute right MCA territory infarct with associated hemorrhagic transformation  at the right basal ganglia. Degree of hemorrhage is decreased from previous, although associated edema and regional mass effect is relatively similar.  2. No other new acute intracranial abnormality.    PHYSICAL EXAM   Temp:  [97.3 F (36.3 C)-99.7 F (37.6 C)] 99 F (37.2 C) (06/05 0400) Pulse Rate:  [63-118] 81 (06/05 0700) Resp:  [13-21] 18 (06/05 0700) BP: (116-162)/(64-94) 147/88 (06/05 0700) SpO2:  [98 %-100 %] 100 % (06/05 0700) FiO2 (%):  [40 %] 40 % (06/05 0354) Weight:  [61 kg] 61 kg (06/05 0500)  General - Well nourished, well developed, intubated off sedation.  Ophthalmologic - fundi not visualized due to noncooperation.  Cardiovascular - irregularly irregular heart rate and rhythm.  Neuro - intubated off sedation, eyes closed but briefly half way open with voice. She is able to follow all simple commands on the right hand and foot. With forced eye  opening, eyes right gaze preference, but able to have left gaze with prompt, not blinking to visual threat bilaterally consistently, doll's eyes sluggish, not tracking, PERRL. Corneal reflex present bilaterally, gag and cough present. Breathing over the vent.  Facial symmetry not able to test due to ET tube.  Tongue protrusion not cooperative. right UE 3/5 spontaneously and RLE 2+/5 with pain, but no movement of LUE, mild withdrawal LLE. DTR 1+ and no babinski. Sensation, coordination and gait not tested.  Neuro exam same as yesterday  ASSESSMENT/PLAN Danielle Malone is a 86 y.o. female with history of AF not on AC d/t fall risk, HTN, HLD, CKD presenting with left facial droop, severe dysarthria and left sided paralysis. Not a tPA candidate d/t time window. Sent to IR w/ mid R M1 occlusion.  Stroke:  R MCA infarct s/p IR w/ TICI3 revascularization and hemorrhagic transformation, infarct embolic secondary to known AF not on AC  CT head R basal ganglia, internal capsule, anterior R temporal lobe infarct. R M1 subtle  hypodensity, ? Thrombus. No hemorrhage. ASPECTS 6    CTA head distal R M1 occlusion w/ no flow proximal  R M2 and poorly seen distal R MCA branches  CTA neck B ICA bifurcation atherosclerosis. 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 Multifocal sclerotic lesions cervical and thoracic spine suspiciou for osseous metastatic dz. R>L pleural effusions.   CT perfusion R MCA 45mL core infarct. 121mL hypoperfusion. Mismatch 41mL.  Cerebral angio mid R M1 occlusion s/p mechanical thrombectomy w/ TICI3 revascularization   Post IR CT no hemorrhage    MRI  Evolving R MCA territory infarct w/ possible hemorrhagic transformation R lentiform nucleus. R ICA siphon flow void, possible reocclusion.   CT head  Evolving R basal ganglia infarct w/ R lentiform nucleus hemorrhagic transformation. Mild edema.   CT repeat 6/2 combination of HT and contrast staining at right BG and caudate. Similar edema and mass effect   CT - repeat 06/20/19 - Normal expected interval evolution of subacute right MCA territory infarct with associated hemorrhagic transformation at the right basal ganglia.   2D Echo EF 55-60%   LDL 87  HgbA1c 6.6  SCDs for VTE prophylaxis  aspirin 81 mg daily prior to admission, now on No antithrombotic due to hemorrhagic conversion.   Therapy recommendations:  pending - not medically ready to participate in therapy.  Disposition:  pending   Acute Respiratory failure d/t encephalopathy, stroke  Intubated for IR, left intubated for airway protection  Off sedation   CCM onboard   Continue weaning trial today  Extubate as able  Family stated that pt would not want trach or PEG but hopeful for extubation - Full code status  Atrial Fibrillation  Home anticoagulation:  none d/t fall risk   Mild RVR, resolved  Rate controlled now  Resume home cardizem   Resume metoprolol 25->12.5->25 bid   Hypertensive Urgency Mild hypotension   SBP 164/109 on  arrival  Home meds:  Diltiazem 240, lasix 20, metoprolol 25, spironolactone 25   Treated with Cleviprex, now off  Stable   Resume home cardizem  Resume metoprolol 25->12.5->25 bid  . Keep SBP < 160 given hemorrhagic transformation  . Long-term BP goal normotensive  Hyperlipidemia  Home meds:  liptitor 20   LDL 87, goal < 70  Resume lipitor 20 - no high dose statin due to advanced age and relatively low LDL  Continue statin at discharge  Prediabetes vs Diabetes type II  Home meds:  None  HgbA1c 6.6, at goal < 7.0  SSI  CBG monitoring  PCP follow up  Dysphagia . Secondary to stroke . NPO . TF via [employer] @45  . Gentle IVF @40  . Speech on board   Other Stroke Risk Factors  Advanced age  SSS  On estrace vaginal cream PTA  Other Active Problems  Glaucoma on timoptic   Osteopenia on fosamax PTA  Hypocalcemia   Hypophosphatemia - improving  Hypothyroidism on synthroid   Code Status - Full code  Hospital day # 4  Patient continues to be critically ill for the last 24 hours, continues to have lethargy and needed ventilation support, did not wean well and not able to extubate, and I have resumed cardizem and metoproolo, and also reviewed CT.     This patient is critically ill due to right MCA stroke, hemorrhagic conversion, A. fib RVR, respiratory failure and at significant risk of neurological worsening, death form recurrent stroke, hematoma expansion, heart failure, cerebral edema, aspiration pneumonia and sepsis. This patient's care requires constant monitoring of vital signs, hemodynamics, respiratory and cardiac monitoring, review of multiple databases, neurological assessment, discussion with family, other specialists and medical decision making of high complexity. I spent 35 minutes of neurocritical care time in the care of this patient.   Rosalin Hawking, MD PhD Stroke Neurology 06/20/2019 10:09 AM   To contact Stroke Continuity provider, please  refer to [email or web address] After hours, contact General Neurology

## 2019-06-20 NOTE — Progress Notes (Signed)
Spoke with patient's daughter and son at bedside  They know she will not want to be on long-term ventilator support They want to give her till tomorrow to see what she does  I reassured them that we will attempt to wean as tolerated Concern remains what she will be able to do with her profound weakness and being able to protect her airway  Will continue to wean Will update them again tomorrow  Tentative plan will be whenever they come around tomorrow, depending on how she does, decision may be made to do a one-way extubation

## 2019-06-20 NOTE — Progress Notes (Addendum)
NAME:  Danielle Malone, MRN:  [id number], DOB:  April 11, 1925, LOS: 4 ADMISSION DATE:  06/25/2019, CONSULTATION DATE:  06/27/2019 REFERRING MD:  Cheral Marker MD, CHIEF COMPLAINT:  Left hemiplegia s/p thrombectomy  Brief History   86 yr old F w/ PMHx sig for Afib, Pulm HTN, CKD stage 3, SSS presented on 06/01 with left hemiplegia LSN 0600 found to have an acute ischemic stroke involving distal M1Right MCA. Was not a candidate for TPA. POD 0 cerebral angiogram & mechanical thrombectomy of right M1 MCA with resolution of occlusive thrombus. MRI to follow. PCCM consulted for ventilator mgmt.  Past Medical History  Chronic atrial fibrillation Pulmonary HTN Essential HTN HLD CKD stage 3 O2 dependent at night Glaucoma Sacral fracture SSS  Home medications: ASA and Atorvastatin per Neurology documentation. None available on EMR at time of consult  [facility] Events   6/01 Admit with left sided weakness, s/p mechanical thrombectomy 6/04 On vent, PSV wean.  Mental status prohibits extubation   Consults:  Anesthesia IR PCCM  Procedures:  Endotracheally intubated Post mechanical thrombectomy  Significant Diagnostic Tests:  06/24/2019 CT head: Acute ischemic infarction changes involving the right basal ganglia, internal capsule and anterior right temporal lobe. ASPECTS is 6. Subtle hyperdensity is questioned in the region of the distal M1 right MCA, which may reflect thrombus. Correlate with pending CTA Head/neck. No evidence of acute intracranial hemorrhage. Background mild generalized parenchymal atrophy and chronic small vessel ischemic disease.  CTA head/neck 6/1>>> CTA neck: 1. The common carotid, internal carotid and vertebral arteries are patent within the neck without hemodynamically significant stenosis. Mild atherosclerotic plaque within the carotid bifurcations bilaterally. 2. The left internal carotid artery appears to originate distal to the left subclavian  takeoff. 3. 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 4. Multifocal sclerotic lesions within the cervical and visualized thoracic spine suspicious for osseous metastatic disease. 5. Partially imaged pleural effusions (greater on the right).  CTA head: Abrupt occlusion of the distal M1 right middle cerebral artery. There is non opacification of adjacent proximal M2 right MCA branch vessels. There is poor opacification of more distal right MCA branch vessels.  CT perfusion head: The perfusion software detects a 10 mL region of core infarction within the right MCA vascular territory, which is likely underestimated given findings on noncontrast head CT performed earlier the same day. The perfusion software detects a 107 mL region of hypoperfusion within the right MCA vascular territory utilizing a Tmax>6 seconds threshold. Reported mismatch volume: 97 mL.   ECHO on 04/27/19 Left ventricular systolic function is normal.  LV ejection fraction = 60-65%.  No segmental wall motion abnormalities seen in the left ventricle  Left ventricular filling pattern is indeterminate.  The right ventricle is mildly dilated.  The right ventricular systolic function is moderately reduced.  The left atrium is severely dilated.  The right atrium is severely dilated.  There is mild to moderate aortic stenosis.  There is moderate mitral regurgitation.   CT head 6/2>>> Evolving recent right MCA territory infarction. There is a combination of hemorrhagic transformation and contrast staining about the right lentiform nucleus and to a lesser extent along the caudate. Similar associated edema and mild mass effect.  Micro Data:  07/06/2019: SARSCOV2--> negative MRSA PCR negative  Antimicrobials:  none   Interim history/subjective:  No changes overnight  Comfortable on vent  Objective   Blood pressure 127/70, pulse  71, temperature (!) 97.5  F (36.4 C), temperature source Axillary, resp. rate 19, weight 61 kg, SpO2 100 %.    Vent Mode: PRVC FiO2 (%):  [40 %] 40 % Set Rate:  [16 bmp] 16 bmp Vt Set:  [420 mL] 420 mL PEEP:  [5 cmH20] 5 cmH20 Pressure Support:  [8 cmH20] 8 cmH20 Plateau Pressure:  [13 cmH20] 13 cmH20   Intake/Output Summary (Last 24 hours) at 06/20/2019 0915 Last data filed at 06/20/2019 0700 Gross per 24 hour  Intake 2012.47 ml  Output 985 ml  Net 1027.47 ml   Filed Weights   07/02/2019 2100 06/19/19 0443 06/20/19 0500  Weight: 58.9 kg 61.4 kg 61 kg    Examination: General: Elderly lady, frail appearing, bruising on arm HEENT: Moist oral mucosa, endotracheal tube in place Neuro: eyes closed, opens to voice, follows commands on right side. Unable to lift head off bed.-Lifts right upper extremity, left right lower extremity, wiggles left lower extremity CV: M0Q6 rrr, systolic ejection murmur PULM: Clear breath sounds anteriorly, few rales at the bases GI: soft, bsx4 active  Extremities: No edema Skin: Bruising on upper extremities   Assessment & Plan:   Acute encephalopathy s/p ischemic CVA of Right M1 MCA s/p mechanical thrombectomy  -Has been stable off sedation -Neurology continues to follow -She appears very frail -Likely to struggle postextubation with the significant weakness Encephalopathy appears related to CVA  Acute Respiratory failure requiring mechanical ventilation secondary to encephalopathy Airway protection Hypoxemic respiratory failure -PRVC 8cc as rest mode  -daily SBT as tolerated  -Chest x-ray does appear stable, improvement in pulmonary congestion -concern she will not be able to protect airway given overall weakness    BP control post mechanical thrombectomy Dilated cardiomyopathy with preserved LV fn EF 60-65% with reduced RV fn and h/o Pulm HTN Mild to moderate AS with moderate MR on last ECHO HTN, HLD -continue cardizem PT  -continue  lopressor 12.5 BID  Severe protein calorie malnutrition -Continue tube feeds  We will continue wake-up assessments Follow-up discussions with family regarding anticipated difficulty with protecting airway Previous ABG did show respiratory alkalosis-will repeat today  Best practice:  Diet: TF Pain/Anxiety/Delirium protocol (if indicated): minimize sedation  VAP protocol (if indicated): yes DVT prophylaxis: SCDs only GI prophylaxis: Protonix Glucose control: SSI goal BG 140-180 mg/dl Mobility: bedrest Code Status: Full Family Communication: Will update Disposition: ICU  Labs   CBC: Recent Labs  Lab 06/19/2019 1724 06/28/2019 1727 06/17/19 0342 06/18/19 0625 06/19/19 0700  WBC 8.1  --   --  9.7 9.8  NEUTROABS 7.0  --   --   --   --   HGB 14.9 16.3* 12.9 12.3 12.4  HCT 45.6 48.0* 38.0 38.5 38.9  MCV 88.5  --   --  91.0 90.5  PLT 184  --   --  150 136*    Basic Metabolic Panel: Recent Labs  Lab 06/29/2019 1724 06/27/2019 1727 06/17/19 0342 06/17/19 1229 06/17/19 1635 06/18/19 0625 06/18/19 1829 06/19/19 0700  NA 132* 131* 134*  --   --  135  --  135  K 4.2 4.0 3.7  --   --  3.6  --  3.5  CL 96* 95*  --   --   --  104  --  105  CO2 21*  --   --   --   --  22  --  22  GLUCOSE 143* 141*  --   --   --  128*  --  119*  BUN 14 16  --   --   --  19  --  11  CREATININE 0.78 0.40*  --   --   --  0.68  --  0.50  CALCIUM 8.9  --   --   --   --  7.6*  --  7.5*  MG  --   --   --  1.7 1.7 1.9 1.7  --   PHOS  --   --   --  3.5 3.3 1.8* 2.4*  --    GFR: CrCl cannot be calculated (Unknown ideal weight.). Recent Labs  Lab 06/19/2019 1724 06/21/2019 2135 06/17/19 0126 06/18/19 0625 06/19/19 0700  WBC 8.1  --   --  9.7 9.8  LATICACIDVEN  --  2.3* 1.4  --   --     Liver Function Tests: Recent Labs  Lab 07/10/2019 1724  AST 22  ALT 17  ALKPHOS 57  BILITOT 1.7*  PROT 6.7  ALBUMIN 3.6   No results for input(s): LIPASE, AMYLASE in the last 168 hours. No results for input(s):  AMMONIA in the last 168 hours.  ABG    Component Value Date/Time   PHART 7.458 (H) 06/17/2019 0342   PCO2ART 30.7 (L) 06/17/2019 0342   PO2ART 75 (L) 06/17/2019 0342   HCO3 21.9 06/17/2019 0342   TCO2 23 06/17/2019 0342   ACIDBASEDEF 1.0 06/17/2019 0342   O2SAT 96.0 06/17/2019 0342     Coagulation Profile: Recent Labs  Lab 07/14/2019 1724  INR 1.1    Cardiac Enzymes: No results for input(s): CKTOTAL, CKMB, CKMBINDEX, TROPONINI in the last 168 hours.  HbA1C: Hgb A1c MFr Bld  Date/Time Value Ref Range Status  06/17/2019 01:26 AM 6.6 (H) 4.8 - 5.6 % Final    Comment:    (NOTE) Pre diabetes:          5.7%-6.4% Diabetes:              >6.4% Glycemic control for   <7.0% adults with diabetes     CBG: Recent Labs  Lab 06/19/19 1550 06/19/19 1923 06/19/19 2331 06/20/19 0331 06/20/19 0716  GLUCAP 146* 128* 128* 140* 132*   The patient is critically ill with multiple organ systems failure and requires high complexity decision making for assessment and support, frequent evaluation and titration of therapies, application of advanced monitoring technologies and extensive interpretation of multiple databases. Critical Care Time devoted to patient care services described in this note independent of APP/resident time (if applicable)  is 32 minutes.   Sherrilyn Rist MD [facility] Personal pager: [phone] If unanswered, please page CCM On-call: [phone]

## 2019-06-20 NOTE — Progress Notes (Signed)
SLP Cancellation Note  Patient Details Name: Danielle Malone MRN: [id number] DOB: [birth year]   Cancelled treatment:       Pt remains intubated and is not appropriate for cognitive linguistic evaluation at this time.  SLP to follow and complete assessment when pt is medically ready.   Celedonio Savage, [place], [place] Office: [phone] 06/20/2019, 12:25 PM

## 2019-06-20 NOTE — Plan of Care (Signed)
  Problem: Ischemic Stroke/TIA Tissue Perfusion: Goal: Complications of ischemic stroke/TIA will be minimized Outcome: Not Progressing   

## 2019-06-21 LAB — CBC
HCT: 38.8 % (ref 36.0–46.0)
Hemoglobin: 12.6 g/dL (ref 12.0–15.0)
MCH: 28.7 pg (ref 26.0–34.0)
MCHC: 32.5 g/dL (ref 30.0–36.0)
MCV: 88.4 fL (ref 80.0–100.0)
Platelets: 172 10*3/uL (ref 150–400)
RBC: 4.39 MIL/uL (ref 3.87–5.11)
RDW: 14.3 % (ref 11.5–15.5)
WBC: 9.3 10*3/uL (ref 4.0–10.5)
nRBC: 0 % (ref 0.0–0.2)

## 2019-06-21 LAB — GLUCOSE, CAPILLARY
Glucose-Capillary: 115 mg/dL — ABNORMAL HIGH (ref 70–99)
Glucose-Capillary: 120 mg/dL — ABNORMAL HIGH (ref 70–99)
Glucose-Capillary: 134 mg/dL — ABNORMAL HIGH (ref 70–99)
Glucose-Capillary: 137 mg/dL — ABNORMAL HIGH (ref 70–99)
Glucose-Capillary: 154 mg/dL — ABNORMAL HIGH (ref 70–99)
Glucose-Capillary: 99 mg/dL (ref 70–99)

## 2019-06-21 LAB — BASIC METABOLIC PANEL
Anion gap: 7 (ref 5–15)
BUN: 17 mg/dL (ref 8–23)
CO2: 22 mmol/L (ref 22–32)
Calcium: 7.6 mg/dL — ABNORMAL LOW (ref 8.9–10.3)
Chloride: 101 mmol/L (ref 98–111)
Creatinine, Ser: 0.47 mg/dL (ref 0.44–1.00)
GFR calc Af Amer: >60 mL/min (ref >60)
GFR calc non Af Amer: >60 mL/min (ref >60)
Glucose, Bld: 139 mg/dL — ABNORMAL HIGH (ref 70–99)
Potassium: 4.6 mmol/L (ref 3.5–5.1)
Sodium: 130 mmol/L — ABNORMAL LOW (ref 135–145)

## 2019-06-21 MED ORDER — DILTIAZEM HCL 30 MG PO TABS
30.0000 mg | ORAL_TABLET | Freq: Four times a day (QID) | ORAL | Status: DC
  Administered 2019-06-21 (×3): 30 mg
  Filled 2019-06-21 (×5): qty 1

## 2019-06-21 MED ORDER — MORPHINE SULFATE (PF) 2 MG/ML IV SOLN
1.0000 mg | INTRAVENOUS | Status: DC | PRN
  Administered 2019-06-21 – 2019-06-22 (×5): 1 mg via INTRAVENOUS
  Filled 2019-06-21 (×5): qty 1

## 2019-06-21 NOTE — Progress Notes (Signed)
STROKE TEAM PROGRESS NOTE   INTERVAL HISTORY  Patient remains intubated for respiratory failure however appears to be weaning currently on pressure support of 5 however due to her significant hemiplegia and concerns about airway protection it is unclear if she can be safely extubated.  Critical care MD has discussed with patient's family and they are leaning towards one-way extubation and DNR.  Vital signs stable.  Blood pressure well controlled.  She can be aroused and follows commands on the right but still remains plegic on the left.    Vitals:   06/21/19 0600 06/21/19 0700 06/21/19 0749 06/21/19 0800  BP: (!) 154/86 127/75  (!) 144/81  Pulse: 100 60  91  Resp: 20 16  19   Temp:      TempSrc:      SpO2: 100% 100% 100% 99%  Weight:       CBC:  Recent Labs  Lab 06/25/2019 1724 06/28/2019 1727 06/19/19 0700 06/19/19 0700 06/20/19 0912 06/20/19 1040  WBC 8.1   < > 9.8  --  11.2*  --   NEUTROABS 7.0  --   --   --   --   --   HGB 14.9   < > 12.4   < > 12.6 12.6  HCT 45.6   < > 38.9   < > 38.7 37.0  MCV 88.5   < > 90.5  --  88.8  --   PLT 184   < > 136*  --  126*  --    < > = values in this interval not displayed.   Basic Metabolic Panel:  Recent Labs  Lab 06/18/19 0625 06/18/19 1829 06/19/19 0700 06/20/19 0912 06/20/19 0912 06/20/19 1040 06/21/19 0425  NA 135  --    < > 134*   < > 133* 130*  K 3.6  --    < > 3.9   < > 3.7 4.6  CL 104  --    < > 102  --   --  101  CO2 22  --    < > 22  --   --  22  GLUCOSE 128*  --    < > 137*  --   --  139*  BUN 19  --    < > 15  --   --  17  CREATININE 0.68  --    < > 0.45  --   --  0.47  CALCIUM 7.6*  --    < > 7.6*  --   --  7.6*  MG 1.9 1.7  --   --   --   --   --   PHOS 1.8* 2.4*  --   --   --   --   --    < > = values in this interval not displayed.   Lipid Panel:     Component Value Date/Time   CHOL 174 06/17/2019 0126   TRIG 34 06/19/2019 0700   HDL 73 06/17/2019 0126   CHOLHDL 2.4 06/17/2019 0126   VLDL 14 06/17/2019  0126   LDLCALC 87 06/17/2019 0126   HgbA1c:  Lab Results  Component Value Date   HGBA1C 6.6 (H) 06/17/2019   Urine Drug Screen:     Component Value Date/Time   LABOPIA NONE DETECTED 07/11/2019 1955   COCAINSCRNUR NONE DETECTED 07/15/2019 1955   LABBENZ NONE DETECTED 07/08/2019 1955   AMPHETMU NONE DETECTED 07/04/2019 [place] NONE DETECTED 06/20/2019 1955   LABBARB  NONE DETECTED 06/19/2019 1955    Alcohol Level     Component Value Date/Time   ETH <10 06/30/2019 1724    IMAGING past 24 hours  CT HEAD WO CONTRAST 06/20/2019 IMPRESSION:  1. Normal expected interval evolution of subacute right MCA territory infarct with associated hemorrhagic transformation at the right basal ganglia. Degree of hemorrhage is decreased from previous, although associated edema and regional mass effect is relatively similar.  2. No other new acute intracranial abnormality.   DG Chest Port 1 View 06/20/19 IMPRESSION: 1. Mild interval improvement. Decrease interstitial lung opacities when compared to the most recent prior study, suggesting improved, resolved interstitial edema. 2. Persistent bilateral pleural effusions with dependent lung base opacity, the latter most likely atelectasis. 3. Stable support apparatus   PHYSICAL EXAM  Frail elderly Caucasian lady who is intubated not sedated on ventilatory support.  Temp:  [97.1 F (36.2 C)-98.5 F (36.9 C)] 98.5 F (36.9 C) (06/06 0400) Pulse Rate:  [31-109] 91 (06/06 0800) Resp:  [15-23] 19 (06/06 0800) BP: (108-154)/(58-92) 144/81 (06/06 0800) SpO2:  [99 %-100 %] 99 % (06/06 0800) FiO2 (%):  [40 %] 40 % (06/06 0749) Weight:  [64.3 kg] 64.3 kg (06/06 0350)  General -frail elderly Caucasian lady who is intubated not sedated and is weaning on ventilatory support Ophthalmologic - fundi not visualized due to noncooperation.  Cardiovascular - irregularly irregular heart rate and rhythm.  Neuro - intubated off sedation, eyes closed but  briefly half way open with voice. She is able to follows most simple commands on the right hand and foot. With forced eye opening, eyes right gaze preference, but able to have left gaze with prompt, not blinking to visual threat bilaterally consistently, doll's eyes sluggish, not tracking, PERRL. Corneal reflex present bilaterally, gag and cough present. Breathing over the vent.  Facial symmetry not able to test due to ET tube.  Tongue protrusion not cooperative. right UE 3/5 spontaneously and RLE 2+/5 with pain, but no movement of LUE, mild withdrawal LLE. DTR 1+ and no babinski. Sensation, coordination and gait not tested.     ASSESSMENT/PLAN Ms. CHIARA COLTRIN is a 86 y.o. female with history of AF not on AC d/t fall risk, HTN, HLD, CKD presenting with left facial droop, severe dysarthria and left sided paralysis. Not a tPA candidate d/t time window. Sent to IR w/ mid R M1 occlusion.  Stroke:  R MCA infarct s/p IR w/ TICI3 revascularization and hemorrhagic transformation, infarct embolic secondary to known AF not on AC  CT head R basal ganglia, internal capsule, anterior R temporal lobe infarct. R M1 subtle hypodensity, ? Thrombus. No hemorrhage. ASPECTS 6    CTA head distal R M1 occlusion w/ no flow proximal  R M2 and poorly seen distal R MCA branches  CTA neck B ICA bifurcation atherosclerosis. 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 Multifocal sclerotic lesions cervical and thoracic spine suspiciou for osseous metastatic dz. R>L pleural effusions.   CT perfusion R MCA 49mL core infarct. 150mL hypoperfusion. Mismatch 21mL.  Cerebral angio mid R M1 occlusion s/p mechanical thrombectomy w/ TICI3 revascularization   Post IR CT no hemorrhage    MRI  Evolving R MCA territory infarct w/ possible hemorrhagic transformation R lentiform nucleus. R ICA siphon flow void, possible reocclusion.   CT head  Evolving R basal ganglia infarct w/ R lentiform nucleus hemorrhagic  transformation. Mild edema.   CT repeat 6/2 combination of HT and contrast staining at  right BG and caudate. Similar edema and mass effect   CT - repeat 06/20/19 - Normal expected interval evolution of subacute right MCA territory infarct with associated hemorrhagic transformation at the right basal ganglia.   2D Echo EF 55-60%   LDL 87  HgbA1c 6.6  SCDs for VTE prophylaxis  aspirin 81 mg daily prior to admission, now on No antithrombotic due to hemorrhagic conversion.   Therapy recommendations:  pending - not medically ready to participate in therapy.  Disposition:  pending   Acute Respiratory failure d/t encephalopathy, stroke  Intubated for IR, left intubated for airway protection  Off sedation   CCM onboard   Continue weaning trial today  Extubate as able  Family stated that pt would not want trach or PEG but hopeful for extubation - Full code status  Atrial Fibrillation  Home anticoagulation:  none d/t fall risk   Mild RVR, resolved  Rate mostly controlled now - occasionally >100  Resume home Cardizem 60 mg Q 6 hrs  Resume metoprolol 25 bid   Hypertensive Urgency Mild hypotension   SBP 164/109 on arrival  Home meds:  Diltiazem 240, lasix 20, metoprolol 25, spironolactone 25   Treated with Cleviprex, now off  Stable (SBP 127 - 154 today)  Resume home cardizem  Resume metoprolol 25->12.5->25 bid  . Keep SBP < 160 given hemorrhagic transformation  . Long-term BP goal normotensive  Hyperlipidemia  Home meds:  liptitor 20   LDL 87, goal < 70  Resume lipitor 20 - no high dose statin due to advanced age and relatively low LDL  Continue statin at discharge  Prediabetes vs Diabetes type II   Home meds:  None  HgbA1c 6.6, at goal < 7.0  SSI  CBG monitoring  PCP follow up  Dysphagia . Secondary to stroke . NPO . TF via [place] @45  . Gentle IVF @40  . Speech on board   Other Stroke Risk Factors  Advanced age  SSS  On estrace  vaginal cream PTA  Other Active Problems  Glaucoma on timoptic   Osteopenia on fosamax PTA  Hypocalcemia   Hypophosphatemia - improving  Hypothyroidism on synthroid   Code Status - Full code  Mild leukocytosis - 11.2 (afebrile)  Hospital day # 5  Patient continues to be critically ill for the last 24 hours, continues to have lethargy and needs ventilatory support, she is presently weaning well and may be able to extubate soon but family needs to be clear about goals of care and likely make her DNR and one-way extubation as she clearly does not want trach and PEG as per family.  Discussed with Dr. Jenetta Downer critical care medicine no family at the bedside today for discussion. This patient is critically ill due to right MCA stroke, hemorrhagic conversion, A. fib RVR, respiratory failure and at significant risk of neurological worsening, death form recurrent stroke, hematoma expansion, heart failure, cerebral edema, aspiration pneumonia and sepsis. This patient's care requires constant monitoring of vital signs, hemodynamics, respiratory and cardiac monitoring, review of multiple databases, neurological assessment, discussion with family, other specialists and medical decision making of high complexity. I spent 30 minutes of neurocritical care time in the care of this patient.   Antony Contras, MD   Phone [phone] Fax [phone]  To contact Stroke Continuity provider, please refer to [email or web address] After hours, contact General Neurology

## 2019-06-21 NOTE — Progress Notes (Signed)
NAME:  Danielle Malone, MRN:  [id number], DOB:  [birth year], LOS: 5 ADMISSION DATE:  07/12/2019, CONSULTATION DATE:  06/23/2019 REFERRING MD:  Cheral Marker MD, CHIEF COMPLAINT:  Left hemiplegia s/p thrombectomy  Brief History   86 yr old F w/ PMHx sig for Afib, Pulm HTN, CKD stage 3, SSS presented on 06/01 with left hemiplegia LSN 0600 found to have an acute ischemic stroke involving distal M1Right MCA. Was not a candidate for TPA. POD 0 cerebral angiogram & mechanical thrombectomy of right M1 MCA with resolution of occlusive thrombus. MRI to follow. PCCM consulted for ventilator mgmt.  Past Medical History  Chronic atrial fibrillation Pulmonary HTN Essential HTN HLD CKD stage 3 O2 dependent at night Glaucoma Sacral fracture SSS  Home medications: ASA and Atorvastatin per Neurology documentation. None available on EMR at time of consult  [place] [facility] Events   6/01 Admit with left sided weakness, s/p mechanical thrombectomy 6/04 On vent, PSV wean.  Mental status prohibits extubation   Consults:  Anesthesia IR PCCM  Procedures:  Endotracheally intubated Post mechanical thrombectomy  Significant Diagnostic Tests:  07/15/2019 CT head: Acute ischemic infarction changes involving the right basal ganglia, internal capsule and anterior right temporal lobe. ASPECTS is 6. Subtle hyperdensity is questioned in the region of the distal M1 right MCA, which may reflect thrombus. Correlate with pending CTA Head/neck. No evidence of acute intracranial hemorrhage. Background mild generalized parenchymal atrophy and chronic small vessel ischemic disease.  CTA head/neck 6/1>>> CTA neck: 1. The common carotid, internal carotid and vertebral arteries are patent within the neck without hemodynamically significant stenosis. Mild atherosclerotic plaque within the carotid bifurcations bilaterally. 2. The left internal carotid artery appears to originate distal to the left subclavian  takeoff. 3. 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 4. Multifocal sclerotic lesions within the cervical and visualized thoracic spine suspicious for osseous metastatic disease. 5. Partially imaged pleural effusions (greater on the right).  CTA head: Abrupt occlusion of the distal M1 right middle cerebral artery. There is non opacification of adjacent proximal M2 right MCA branch vessels. There is poor opacification of more distal right MCA branch vessels.  CT perfusion head: The perfusion software detects a 10 mL region of core infarction within the right MCA vascular territory, which is likely underestimated given findings on noncontrast head CT performed earlier the same day. The perfusion software detects a 107 mL region of hypoperfusion within the right MCA vascular territory utilizing a Tmax>6 seconds threshold. Reported mismatch volume: 97 mL.   ECHO on 04/27/19 Left ventricular systolic function is normal.  LV ejection fraction = 60-65%.  No segmental wall motion abnormalities seen in the left ventricle  Left ventricular filling pattern is indeterminate.  The right ventricle is mildly dilated.  The right ventricular systolic function is moderately reduced.  The left atrium is severely dilated.  The right atrium is severely dilated.  There is mild to moderate aortic stenosis.  There is moderate mitral regurgitation.   CT head 6/2>>> Evolving recent right MCA territory infarction. There is a combination of hemorrhagic transformation and contrast staining about the right lentiform nucleus and to a lesser extent along the caudate. Similar associated edema and mild mass effect.  Micro Data:  06/26/2019: SARSCOV2--> negative MRSA PCR negative  Antimicrobials:  none   Interim history/subjective:  No changes overnight  Comfortable on vent Weaning well on 5/5, concern remains that  she will be able to  sustain her breathing long-term  Objective   Blood pressure (!) 141/82, pulse 96, temperature 98.5 F (36.9 C), temperature source Axillary, resp. rate (!) 21, weight 64.3 kg, SpO2 99 %.    Vent Mode: PSV;CPAP FiO2 (%):  [40 %] 40 % Set Rate:  [16 bmp] 16 bmp Vt Set:  [420 mL] 420 mL PEEP:  [5 cmH20] 5 cmH20 Pressure Support:  [5 cmH20-10 cmH20] 5 cmH20 Plateau Pressure:  [14 cmH20-20 cmH20] 15 cmH20   Intake/Output Summary (Last 24 hours) at 06/21/2019 0947 Last data filed at 06/21/2019 0900 Gross per 24 hour  Intake 1989.02 ml  Output 1050 ml  Net 939.02 ml   Filed Weights   06/19/19 0443 06/20/19 0500 06/21/19 0350  Weight: 61.4 kg 61 kg 64.3 kg    Examination: General: Elderly lady, frail appearing, bruising on arm.  Follows commands HEENT: Moist oral mucosa, endotracheal tube in place Neuro: eyes closed, opens to voice, follows commands on right side. Unable to lift head off bed.-Lifts right upper extremity, left right lower extremity, slight movement left lower extremity CV: U3A4 rrr, systolic ejection murmur PULM: Clear breath sounds bilaterally GI: soft, bsx4 active  Extremities: No edema Skin: Bruising on upper extremities   Assessment & Plan:   Acute encephalopathy s/p ischemic CVA of Right M1 MCA s/p mechanical thrombectomy  -Has been stable off sedation -Neurology continues to follow -She appears very frail -May struggle long-term with being able to clear secretions Encephalopathy appears related to CVA-stable  Acute Respiratory failure requiring mechanical ventilation secondary to encephalopathy Airway protection Hypoxemic respiratory failure -PRVC 8cc as rest mode  -daily SBT as tolerated  -Chest x-ray does appear stable, improvement in pulmonary congestion -concern she will not be able to protect airway given overall weakness -Tolerating weaning today -Discussed with family at bedside-we will extubate, she will be DNR  BP control  post mechanical thrombectomy Dilated cardiomyopathy with preserved LV fn EF 60-65% with reduced RV fn and h/o Pulm HTN Mild to moderate AS with moderate MR on last ECHO HTN, HLD -continue cardizem-dose of Cardizem reduced to 30 from 41 -continue lopressor 12.5 BID  Severe protein calorie malnutrition -Continue tube feeds  Discussed with family at bedside She appears to be weaning well Concern about being able to protect her airway longer term was brought up She will not want to live on a ventilator We will extubate today  Best practice:  Diet: TF Pain/Anxiety/Delirium protocol (if indicated): minimize sedation  VAP protocol (if indicated): yes DVT prophylaxis: SCDs only GI prophylaxis: Protonix Glucose control: SSI goal BG 140-180 mg/dl Mobility: bedrest Code Status: Full Family Communication: Will update Disposition: ICU  Labs   CBC: Recent Labs  Lab 07/05/2019 1724 07/12/2019 1727 06/17/19 0342 06/18/19 0625 06/19/19 0700 06/20/19 0912 06/20/19 1040  WBC 8.1  --   --  9.7 9.8 11.2*  --   NEUTROABS 7.0  --   --   --   --   --   --   HGB 14.9   < > 12.9 12.3 12.4 12.6 12.6  HCT 45.6   < > 38.0 38.5 38.9 38.7 37.0  MCV 88.5  --   --  91.0 90.5 88.8  --   PLT 184  --   --  150 136* 126*  --    < > = values in this interval not displayed.    Basic Metabolic Panel: Recent Labs  Lab 06/23/2019 1724 07/10/2019 1724 07/01/2019 1727 06/17/19 0342 06/17/19 1229 06/17/19 1635  06/18/19 0625 06/18/19 1829 06/19/19 0700 06/20/19 0912 06/20/19 1040 06/21/19 0425  NA 132*   < > 131*   < >  --   --  135  --  135 134* 133* 130*  K 4.2   < > 4.0   < >  --   --  3.6  --  3.5 3.9 3.7 4.6  CL 96*   < > 95*  --   --   --  104  --  105 102  --  101  CO2 21*  --   --   --   --   --  22  --  22 22  --  22  GLUCOSE 143*   < > 141*  --   --   --  128*  --  119* 137*  --  139*  BUN 14   < > 16  --   --   --  19  --  11 15  --  17  CREATININE 0.78   < > 0.40*  --   --   --  0.68  --   0.50 0.45  --  0.47  CALCIUM 8.9  --   --   --   --   --  7.6*  --  7.5* 7.6*  --  7.6*  MG  --   --   --   --  1.7 1.7 1.9 1.7  --   --   --   --   PHOS  --   --   --   --  3.5 3.3 1.8* 2.4*  --   --   --   --    < > = values in this interval not displayed.   GFR: CrCl cannot be calculated (Unknown ideal weight.). Recent Labs  Lab 06/20/2019 1724 06/24/2019 2135 06/17/19 0126 06/18/19 0625 06/19/19 0700 06/20/19 0912  WBC 8.1  --   --  9.7 9.8 11.2*  LATICACIDVEN  --  2.3* 1.4  --   --   --     Liver Function Tests: Recent Labs  Lab 06/21/2019 1724  AST 22  ALT 17  ALKPHOS 57  BILITOT 1.7*  PROT 6.7  ALBUMIN 3.6   No results for input(s): LIPASE, AMYLASE in the last 168 hours. No results for input(s): AMMONIA in the last 168 hours.  ABG    Component Value Date/Time   PHART 7.427 06/20/2019 1040   PCO2ART 39.5 06/20/2019 1040   PO2ART 154 (H) 06/20/2019 1040   HCO3 26.0 06/20/2019 1040   TCO2 27 06/20/2019 1040   ACIDBASEDEF 1.0 06/17/2019 0342   O2SAT 99.0 06/20/2019 1040     Coagulation Profile: Recent Labs  Lab 07/05/2019 1724  INR 1.1    Cardiac Enzymes: No results for input(s): CKTOTAL, CKMB, CKMBINDEX, TROPONINI in the last 168 hours.  HbA1C: Hgb A1c MFr Bld  Date/Time Value Ref Range Status  06/17/2019 01:26 AM 6.6 (H) 4.8 - 5.6 % Final    Comment:    (NOTE) Pre diabetes:          5.7%-6.4% Diabetes:              >6.4% Glycemic control for   <7.0% adults with diabetes     CBG: Recent Labs  Lab 06/20/19 1542 06/20/19 1938 06/20/19 2323 06/21/19 0315 06/21/19 0707  GLUCAP 89 101* 135* 134* 154*   The patient is critically ill with multiple organ systems failure and requires high complexity  decision making for assessment and support, frequent evaluation and titration of therapies, application of advanced monitoring technologies and extensive interpretation of multiple databases. Critical Care Time devoted to patient care services described in  this note independent of APP/resident time (if applicable)  is 35 minutes.   Sherrilyn Rist MD [facility] Personal pager: [phone] If unanswered, please page CCM On-call: [phone]

## 2019-06-21 NOTE — Progress Notes (Signed)
CDS notified, not a candidate at this time. reference # [id number]

## 2019-06-21 NOTE — Procedures (Signed)
Extubation Procedure Note  Patient Details:   Name: Danielle Malone DOB: 1925/02/04 MRN: [id number]   Airway Documentation:    Vent end date: 06/21/19 Vent end time: 1730   Evaluation  O2 sats: stable throughout Complications: No apparent complications Patient did tolerate procedure well. Bilateral Breath Sounds: Clear, Diminished   No,   Gonzella Lex 06/21/2019, 5:32 PM

## 2019-06-21 NOTE — Plan of Care (Signed)
  Problem: Coping: Goal: Will identify appropriate support needs Outcome: Progressing

## 2019-06-22 MED ORDER — LORAZEPAM 1 MG PO TABS: 2.0000 mg | ORAL_TABLET | ORAL | Status: DC | PRN

## 2019-06-22 MED ORDER — MORPHINE 100MG IN NS 100ML (1MG/ML) PREMIX INFUSION: 1.0000 mg/h | INTRAVENOUS | Status: DC

## 2019-06-22 MED ORDER — MORPHINE BOLUS VIA INFUSION
1.0000 mg | INTRAVENOUS | Status: DC | PRN
  Filled 2019-06-22: qty 1

## 2019-06-22 MED ORDER — LORAZEPAM 2 MG/ML IJ SOLN: 1.0000 mg | INTRAMUSCULAR | Status: DC | PRN

## 2019-06-22 MED ORDER — LORAZEPAM 2 MG/ML PO CONC: 1.0000 mg | ORAL | Status: DC | PRN

## 2019-06-25 DIAGNOSIS — R7303 Prediabetes: Secondary | ICD-10-CM | POA: Diagnosis present

## 2019-06-25 DIAGNOSIS — I495 Sick sinus syndrome: Secondary | ICD-10-CM | POA: Diagnosis present

## 2019-06-25 DIAGNOSIS — E039 Hypothyroidism, unspecified: Secondary | ICD-10-CM | POA: Diagnosis present

## 2019-06-25 DIAGNOSIS — E785 Hyperlipidemia, unspecified: Secondary | ICD-10-CM | POA: Diagnosis present

## 2019-06-25 DIAGNOSIS — I69391 Dysphagia following cerebral infarction: Secondary | ICD-10-CM

## 2019-06-25 DIAGNOSIS — M8580 Other specified disorders of bone density and structure, unspecified site: Secondary | ICD-10-CM | POA: Diagnosis present

## 2019-06-25 DIAGNOSIS — H409 Unspecified glaucoma: Secondary | ICD-10-CM | POA: Diagnosis present

## 2019-06-25 DIAGNOSIS — I160 Hypertensive urgency: Secondary | ICD-10-CM | POA: Diagnosis present

## 2019-07-16 NOTE — Progress Notes (Signed)
Called by RN to be notified that pt expired 0311. Death certificate signed and handed to RN in the unit. Stroke team death/discharge summary to follow.  -- Amie Portland, MD [facility]

## 2019-07-16 NOTE — Death Summary Note (Signed)
Death Summary  Patient ID: Danielle Malone   MRN: [id number]      DOB: 04-12-1925  Date of Admission: 06/26/2019 Date of Discharge: 07-06-2019   Attending Physician:  Stroke MD Consultant(s):    pulmonary/intensive care  Patient's PCP:  Myrtis Hopping., MD  DISCHARGE DIAGNOSIS:  Principal Problem:   Stroke (cerebrum) [facility]) Active Problems:   Endotracheally intubated   Chronic heart failure with preserved ejection fraction ([place])   Pulmonary hypertension ([place])   Aortic valve stenosis   Mitral valve insufficiency   Chronic a-fib (HCC)   Protein-calorie malnutrition, severe   Respiratory failure (HCC)   Hypertensive urgency   Hyperlipidemia   Prediabetes   Dysphagia following cerebrovascular accident   SSS (sick sinus syndrome) (HCC)   Glaucoma   Hypothyroidism   Osteopenia   LABORATORY STUDIES CBC    Component Value Date/Time   WBC 9.3 06/21/2019 0844   RBC 4.39 06/21/2019 0844   HGB 12.6 06/21/2019 0844   HCT 38.8 06/21/2019 0844   PLT 172 06/21/2019 0844   MCV 88.4 06/21/2019 0844   MCH 28.7 06/21/2019 0844   MCHC 32.5 06/21/2019 0844   RDW 14.3 06/21/2019 0844   LYMPHSABS 0.7 07/09/2019 1724   MONOABS 0.3 07/05/2019 1724   EOSABS 0.0 06/23/2019 1724   BASOSABS 0.0 06/21/2019 1724   CMP    Component Value Date/Time   NA 130 (L) 06/21/2019 0425   K 4.6 06/21/2019 0425   CL 101 06/21/2019 0425   CO2 22 06/21/2019 0425   GLUCOSE 139 (H) 06/21/2019 0425   BUN 17 06/21/2019 0425   CREATININE 0.47 06/21/2019 0425   CALCIUM 7.6 (L) 06/21/2019 0425   PROT 6.7 06/28/2019 1724   ALBUMIN 3.6 07/03/2019 1724   AST 22 07/12/2019 1724   ALT 17 06/23/2019 1724   ALKPHOS 57 07/15/2019 1724   BILITOT 1.7 (H) 06/24/2019 1724   GFRNONAA >60 06/21/2019 0425   GFRAA >60 06/21/2019 0425   COAGS Lab Results  Component Value Date   INR 1.1 07/07/2019   Lipid Panel    Component Value Date/Time   CHOL 174 06/17/2019 0126   TRIG 34 06/19/2019 0700   HDL 73  06/17/2019 0126   CHOLHDL 2.4 06/17/2019 0126   VLDL 14 06/17/2019 0126   LDLCALC 87 06/17/2019 0126   HgbA1C  Lab Results  Component Value Date   HGBA1C 6.6 (H) 06/17/2019   Urinalysis    Component Value Date/Time   COLORURINE YELLOW 07/01/2019 1955   APPEARANCEUR CLEAR 06/27/2019 1955   LABSPEC 1.039 (H) 07/01/2019 1955   PHURINE 7.0 07/15/2019 1955   GLUCOSEU 50 (A) 06/27/2019 1955   HGBUR MODERATE (A) 07/13/2019 1955   BILIRUBINUR NEGATIVE 07/07/2019 1955   KETONESUR 20 (A) 06/20/2019 1955   PROTEINUR 100 (A) 07/02/2019 1955   NITRITE NEGATIVE 07/05/2019 1955   LEUKOCYTESUR SMALL (A) 06/21/2019 1955   Urine Drug Screen     Component Value Date/Time   LABOPIA NONE DETECTED 06/28/2019 1955   COCAINSCRNUR NONE DETECTED 06/27/2019 1955   LABBENZ NONE DETECTED 07/13/2019 1955   AMPHETMU NONE DETECTED 07/13/2019 1955   THCU NONE DETECTED 07/11/2019 1955   LABBARB NONE DETECTED 07/01/2019 1955    Alcohol Level    Component Value Date/Time   ETH <10 06/28/2019 1724    SIGNIFICANT DIAGNOSTIC STUDIES CT HEAD WO CONTRAST  Result Date: 06/20/2019 CLINICAL DATA:  Follow-up examination for acute stroke. EXAM: CT HEAD WITHOUT CONTRAST TECHNIQUE: Contiguous axial images  were obtained from the base of the skull through the vertex without intravenous contrast. COMPARISON:  Prior CT from 06/17/2019. FINDINGS: Brain: There has been continued interval evolution of the right MCA territory infarct, primarily involving the right basal ganglia. Associated hemorrhagic transformation primarily involving the right caudate and lentiform nuclei again seen, decreased in size from previous, with the dominant hematoma now measuring 5.1 x 1.7 x 2.7 cm. Surrounding vasogenic edema with mild regional mass effect. Right lateral ventricle remains partially effaced with no more than trace right-to-left midline shift. Remainder of the brain is otherwise stable in appearance. No new hemorrhage or acute large  vessel territory infarct. Underlying atrophy with chronic small vessel ischemic disease again noted. No extra-axial fluid collection. Vascular: No hyperdense vessel. Scattered vascular calcifications noted within the carotid siphons. Skull: Scalp soft tissues and calvarium within normal limits. Sinuses/Orbits: Globes and orbital soft tissues within normal limits. Nasogastric tube in place. Right sphenoid sinus disease again noted. Mastoid air cells remain largely clear. Other: None. IMPRESSION: 1. Normal expected interval evolution of subacute right MCA territory infarct with associated hemorrhagic transformation at the right basal ganglia. Degree of hemorrhage is decreased from previous, although associated edema and regional mass effect is relatively similar. 2. No other new acute intracranial abnormality. Electronically Signed   By: Jeannine Boga M.D.   On: 06/20/2019 07:14   CT HEAD WO CONTRAST  Result Date: 06/17/2019 CLINICAL DATA:  Stroke post revascularization, follow-up EXAM: CT HEAD WITHOUT CONTRAST TECHNIQUE: Contiguous axial images were obtained from the base of the skull through the vertex without intravenous contrast. COMPARISON:  07/07/2019 FINDINGS: Brain: Evolving right MCA territory infarction is again identified primarily involving the basal ganglia and adjacent white matter. Hemorrhagic transformation about the right lentiform nucleus is stable in size with mild associated edema. Contrast staining is also present in this region with some areas of decreased hyperdensity. There is decreased hyperdensity along the caudate reflecting contrast staining superimposed on petechial hemorrhage. Stable partial effacement of the right lateral ventricle without midline shift or hydrocephalus. Stable findings of chronic microvascular ischemic changes. Vascular: There is atherosclerotic calcification at the skull base. Skull: Calvarium is unremarkable. Sinuses/Orbits: No acute finding. Other: Mastoid  air cells are clear. IMPRESSION: Evolving recent right MCA territory infarction. There is a combination of hemorrhagic transformation and contrast staining about the right lentiform nucleus and to a lesser extent along the caudate. Similar associated edema and mild mass effect. Electronically Signed   By: Macy Mis M.D.   On: 06/17/2019 14:44   CT HEAD WO CONTRAST  Result Date: 07/08/2019 CLINICAL DATA:  Follow-up examination for acute stroke. EXAM: CT HEAD WITHOUT CONTRAST TECHNIQUE: Contiguous axial images were obtained from the base of the skull through the vertex without intravenous contrast. COMPARISON:  Prior brain MRI and CTs from earlier the same day. FINDINGS: Brain: Evolving right MCA territory infarct involving the right basal ganglia with involvement of the right caudate and lentiform nuclei as well as the anterior right internal capsule again seen. Parenchymal hyperdensity involving the right lentiform nucleus measures 5.3 x 2.2 x 3.2 cm. While this at least in part likely reflects contrast staining related to recent arteriogram, appearance most concerning for hemorrhage given findings on prior MRI. Mild localized edema without significant regional mass effect at this time. No other evidence for acute intracranial hemorrhage. No other acute large vessel territory infarct. No mass lesion or extra-axial fluid collection. No hydrocephalus. Vascular: Contrast material seen diffusely throughout the intracranial circulation. Calcified atherosclerosis present  at the skull base. Skull: Scalp soft tissues and calvarium within normal limits. Sinuses/Orbits: Globes and orbital soft tissues within normal limits. Mild scattered mucosal thickening noted within the ethmoidal air cells and sphenoid sinuses. Mastoid air cells are clear. Endotracheal tube partially visualized. Other: None. IMPRESSION: 1. Evolving right basal ganglia infarct, stable in size and distribution as compared to recent MRI. Superimposed  5.3 x 2.2 x 3.2 cm parenchymal hyperdensity involving the right lentiform nucleus most concerning for hemorrhagic transformation given findings on prior MRI. Mild localized edema without significant regional mass effect at this time. 2. No other new acute intracranial abnormality. These results were communicated to Dr. Rory Percy at 11:43 pmon 06/22/2021by text page via the [facility] messaging system. Electronically Signed   By: Jeannine Boga M.D.   On: 07/14/2019 23:44   MR BRAIN WO CONTRAST  Result Date: 07/10/2019 CLINICAL DATA:  Initial evaluation for neuro deficit, stroke suspected. EXAM: MRI HEAD WITHOUT CONTRAST TECHNIQUE: Multiplanar, multiecho pulse sequences of the brain and surrounding structures were obtained without intravenous contrast. COMPARISON:  Comparison made with prior CTs from earlier the same day. FINDINGS: Brain: Cerebral volume within normal limits for age. Mild chronic microvascular ischemic disease present within the periventricular white matter and pons. Associated small remote lacunar infarcts noted about the thalami. Evolving acute ischemic right MCA territory infarct seen involving the right basal ganglia, with involvement of the right caudate and lentiform nuclei as well as the anterior limb of the right internal capsule. Associated susceptibility artifact consistent with blood products. Suggestion of possible hemorrhagic transformation with intraparenchymal hemorrhage at the level of the right lentiform nucleus. Mild localized edema with partial effacement of the adjacent right lateral ventricle with trace localized right-to-left midline shift. No hydrocephalus or trapping. No other evidence for acute or subacute ischemia seen elsewhere within the brain. Gray-white matter differentiation otherwise maintained. No mass lesion or mass effect elsewhere within the brain. No extra-axial fluid collection. Pituitary gland suprasellar region normal. Midline structures intact. Vascular: Slight  asymmetry within the intravascular flow void at the right carotid siphon as compared to the left, suspected to be related to slow flow (series 10, image 7). Major intracranial vascular flow voids otherwise maintained. Skull and upper cervical spine: Craniocervical junction within normal limits. Bone marrow signal intensity normal. No scalp soft tissue abnormality. Sinuses/Orbits: Patient status post bilateral ocular lens replacement. Scattered mucosal thickening noted within the sphenoid ethmoidal sinuses. Paranasal sinuses are otherwise largely clear. No significant mastoid effusion. Inner ear structures grossly normal. Fluid noted layering within the nasopharynx. Other: None. IMPRESSION: 1. Evolving acute ischemic right MCA territory infarct involving the right basal ganglia, with suggestion of possible hemorrhagic transformation at the right lentiform nucleus. Correlation with follow-up head CT suggested for further evaluation. 2. Slight asymmetry within the intravascular flow void at the right carotid siphon as compared to the left, suspected to be related to slow flow. If there is clinical concern for possible reocclusion, further assessment with dedicated CTA and/or MRA could be performed for further evaluation as warranted. 3. Underlying age-related cerebral atrophy with mild chronic small vessel ischemic disease. Critical Value/emergent results were called by telephone at the time of interpretation on 07/14/2019 at 10:59 pm to provider Dr. Rory Percy, who verbally acknowledged these results. Electronically Signed   By: Jeannine Boga M.D.   On: 06/21/2019 23:01   IR CT Head Ltd  Result Date: 06/17/2019 INDICATION: 86 year old female with past medical history significant for chronic atrial fibrillation, pulmonary hypertension, essential hypertension, hyperlipidemia, chronic kidney  disease stage 3, O2 dependency at night, glaucoma and sacral fracture with a baseline modified Rankin scale 0. She was found in  the afternoon with left facial droop, severe dysarthria and left-sided paralysis. The last known well was 6 a.m. on 07/10/2019. She was brought to emergency and was found to have a right M1/MCA occlusion. NIHSS was 19. Head CT showed hypodensity of the right basal ganglia. CT perfusion showed a core infarct of 10 mL with a penumbra of 97 mL. She was taken to our service for emergency diagnostic cerebral angiogram and mechanical thrombectomy. EXAM: IR CT HEAD LIMITED; IR PERCUTANEOUS ART THORMBECTOMY/INFUSION INTRACRANIAL INCLUDE DIAG ANGIO COMPARISON:  CT/CT angiogram of the head and neck June 16, 2019. MEDICATIONS: No antibiotics given. ANESTHESIA/SEDATION: The procedure was performed under general anesthesia provided by the Department of Anesthesiology. FLUOROSCOPY TIME:  Fluoroscopy Time: 10 minutes 12 seconds (400 mGy). COMPLICATIONS: None immediate. TECHNIQUE: Informed written consent was obtained from the patient's son after a thorough discussion of the procedural risks, benefits and alternatives. All questions were addressed. Maximal Sterile Barrier Technique was utilized including caps, mask, sterile gowns, sterile gloves, sterile drape, hand hygiene and skin antiseptic. A timeout was performed prior to the initiation of the procedure. The right groin was prepped and draped in the usual sterile fashion. Using a micropuncture kit and the modified Seldinger technique, access was gained to the right common femoral artery and an 8 French sheath was placed. Then, an Wellington balloon guide catheter was navigated over a 6 [place] Berenstein 2 catheter and a 0.035 inch Terumo Glidewire into the aortic arch. Under fluoroscopy, the catheter was placed into the right common carotid artery. Frontal and lateral angiograms of the neck were obtained. The catheter was then advanced into the distal cervical segment of the right ICA. Frontal and lateral angiograms of the head were obtained. FINDINGS: 1. A right carotid  web is noted, without flow limitation. 2. Mild luminal irregularity is noted in the bulb and along the cervical right ICA, suggestive of mild atherosclerotic disease. 3. Complete occlusion of the right MCA is noted at the mid M1 segment. 4. Poor collateral flow from the right ACA to the right MCA territory is seen. 5. Patent bilateral ACAs. PROCEDURE: Under biplane roadmap, a Vecta 71 aspiration catheter was navigated over a phenom 21 microcatheter and a synchro support microguidewire into the cavernous segment of the right ICA. The microcatheter was then navigated over the wire into the right M2/MCA posterior division branch. Then, a 6 x 40 mm solitaire stent retriever was deployed spanning the mid to distal M1 and M2 segments. The device was allowed to intercalated with the clot for 4 minutes. The microcatheter was removed. The aspiration catheter was advanced to the level of occlusion and connected to a penumbra aspiration pump. The guiding catheter balloon was inflated. The thrombectomy device and aspiration catheter were removed under constant aspiration. Follow-up frontal and lateral angiograms of the head were obtained viable guide catheter contrast injection. Complete recanalization of the right MCA vascular tree was achieved (TICI 3). Flat panel CT of the head was obtained and post processed in a separate workstation with concurrent attending physician supervision. Selected images were sent to PACS. Mild contrast staining of the right basal ganglia noted. No hemorrhagic complication seen. The catheter was subsequently withdrawn. The right common femoral artery angiogram was obtained with frontal and lateral views via sheath side port. Prominent tortuosity of the the right external iliac noted. The puncture is at the  level of the common femoral artery. The sheath was then exchanged over the wire for an 8 [place] Angio-Seal which was utilized for access closure. Immediate hemostasis was achieved. IMPRESSION:  1. Successful and uncomplicated mechanical thrombectomy performed with combined technique (aspiration + stent retriever) for treatment of a right MCA/M1 occlusion. Complete recanalization achieved. 2. No embolus to new territory. 3. Contrast staining of the right basal ganglia noted on postprocedural flat panel CT, likely related to ongoing ischemia. No subarachnoid hemorrhage. PLAN: Patient remained intubated given unknown COVID-19 status and was sent to ICU for continued care. Electronically Signed   By: Pedro Earls M.D.   On: 06/17/2019 15:28   CT CEREBRAL PERFUSION W CONTRAST  Result Date: 07/04/2019 CLINICAL DATA:  Neuro deficit, acute, stroke suspected. EXAM: CT ANGIOGRAPHY HEAD AND NECK CT PERFUSION BRAIN TECHNIQUE: Multidetector CT imaging of the head and neck was performed using the standard protocol during bolus administration of intravenous contrast. Multiplanar CT image reconstructions and MIPs were obtained to evaluate the vascular anatomy. Carotid stenosis measurements (when applicable) are obtained utilizing NASCET criteria, using the distal internal carotid diameter as the denominator. Multiphase CT imaging of the brain was performed following IV bolus contrast injection. Subsequent parametric perfusion maps were calculated using RAPID software. CONTRAST:  100 mL Omnipaque 350 intravenous contrast COMPARISON:  Noncontrast head CT performed earlier the same day 07/06/2019 FINDINGS: CTA NECK FINDINGS Aortic arch: The left internal carotid artery appears to originate beyond the origin of the left subclavian takeoff. Atherosclerotic calcification within the visualized aortic arch and proximal major branch vessels of the neck. No hemodynamically significant innominate or proximal subclavian artery stenosis. Right carotid system: CCA and ICA patent within the neck without significant stenosis (50% or greater). Mild atherosclerotic plaque within the carotid bifurcation. Left carotid  system: CCA and ICA patent within the neck without significant stenosis (50% or greater). Mild atherosclerotic plaque within the carotid bifurcation. Vertebral arteries: Codominant and patent within the neck without significant stenosis. 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 Skeleton: Numerous sclerotic lesions within the cervical and visualized thoracic spine suspicious for osseous metastatic disease. Cervical spondylosis. Other neck: No neck mass or cervical lymphadenopathy. Upper chest: Partially imaged bilateral pleural effusions (greater on the right with associated compressive atelectasis. Review of the MIP images confirms the above findings CTA HEAD FINDINGS Anterior circulation: The intracranial internal carotid arteries are patent without significant stenosis. Mild calcified plaque within these vessels. There is abrupt occlusion of the distal M1 right middle cerebral artery. There is non opacification of the adjacent proximal right M2 branch vessels. There is poor opacification of distal M2 and more distal right MCA branch vessels. The M1 left middle cerebral artery is patent without significant stenosis. No left M2 proximal branch occlusion is identified. The anterior cerebral arteries are patent without high-grade proximal stenosis. No intracranial aneurysm is identified. Posterior circulation: The intracranial vertebral arteries are patent without significant stenosis, as is the basilar artery. The posterior cerebral arteries are patent proximally without significant stenosis. Posterior communicating arteries are present bilaterally. Venous sinuses: Within limitations of contrast timing, no convincing thrombus. Anatomic variants: As described Review of the MIP images confirms the above findings CT Brain Perfusion Findings: ASPECTS: 6 CBF (<30%) Volume: 58m within the right MCA vascular  territory Perfusion (Tmax>6.0s) volume: 1024mMismatch Volume: 9763mnfarction Location:Right MCA vascular territory These results were communicated to Dr. LinCheral Marker  At 6:06 pmon 06/27/2021by text page via the [facility] messaging system. IMPRESSION: CTA neck: 1. The common carotid, internal carotid and vertebral arteries are patent within the neck without hemodynamically significant stenosis. Mild atherosclerotic plaque within the carotid bifurcations bilaterally. 2. The left internal carotid artery appears to originate distal to the left subclavian takeoff. 3. 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 4. Multifocal sclerotic lesions within the cervical and visualized thoracic spine suspicious for osseous metastatic disease. 5. Partially imaged pleural effusions (greater on the right). CTA head: Abrupt occlusion of the distal M1 right middle cerebral artery. There is non opacification of adjacent proximal M2 right MCA branch vessels. There is poor opacification of more distal right MCA branch vessels. CT perfusion head: The perfusion software detects a 10 mL region of core infarction within the right MCA vascular territory, which is likely underestimated given findings on noncontrast head CT performed earlier the same day. The perfusion software detects a 107 mL region of hypoperfusion within the right MCA vascular territory utilizing a Tmax>6 seconds threshold. Reported mismatch volume: 97 mL. Electronically Signed   By: Kellie Simmering DO   On: 06/28/2019 18:16   DG CHEST PORT 1 VIEW  Result Date: 06/20/2019 CLINICAL DATA:  Code stroke.  Intubated patient. EXAM: PORTABLE CHEST 1 VIEW COMPARISON:  06/19/2019 FINDINGS: Small bilateral pleural effusions with associated dependent lung base opacity, the latter likely atelectasis, is without significant change from the most recent prior study. Interstitial prominence noted previously has improved. No new  lung abnormalities. No pneumothorax. Endotracheal tube and enteric tube are stable. IMPRESSION: 1. Mild interval improvement. Decrease interstitial lung opacities when compared to the most recent prior study, suggesting improved, resolved interstitial edema. 2. Persistent bilateral pleural effusions with dependent lung base opacity, the latter most likely atelectasis. 3. Stable support apparatus. Electronically Signed   By: Lajean Manes M.D.   On: 06/20/2019 09:05   DG Chest Port 1 View  Result Date: 06/19/2019 CLINICAL DATA:  Stroke EXAM: PORTABLE CHEST 1 VIEW COMPARISON:  07/13/2019 FINDINGS: Endotracheal tube terminates 4.1 cm above the carina. Enteric tube courses below the diaphragm with distal tip beyond the inferior margin of the film. Stable cardiomegaly. Atherosclerotic calcification of the aortic knob. Persistent bilateral interstitial prominence with small bilateral pleural effusions, similar to prior. Osseous structures are demineralized. IMPRESSION: Stable appearance of the chest.  Support apparatus, as above. Electronically Signed   By: Davina Poke D.O.   On: 06/19/2019 08:50   DG CHEST PORT 1 VIEW  Result Date: 07/06/2019 CLINICAL DATA:  Screening for metal prior to MRI EXAM: PORTABLE CHEST 1 VIEW COMPARISON:  11/28/2017 chest radiograph. FINDINGS: Endotracheal tube tip is 4.7 cm above the carina. Several extrinsic EKG leads overlie the chest bilaterally. Otherwise no radiopaque foreign bodies. Stable cardiomediastinal silhouette with mild cardiomegaly. No pneumothorax. Small bilateral pleural effusions. Mild-to-moderate pulmonary edema. IMPRESSION: 1. Endotracheal tube tip is 4.7 cm above the carina. Extrinsic EKG leads overlie the chest bilaterally. Otherwise no radiopaque foreign bodies. 2. Mild-to-moderate congestive heart failure with small bilateral pleural effusions. Electronically Signed   By: Ilona Sorrel M.D.   On: 07/06/2019 20:52   DG Abd Portable 1V  Result Date:  07/13/2019 CLINICAL DATA:  Screening for metal prior to MRI EXAM: PORTABLE ABDOMEN - 1 VIEW COMPARISON:  10/30/2015 CT abdomen/pelvis FINDINGS: Excreted contrast fills the urinary bladder and renal collecting systems. Extrinsic EKG leads overlie the lower chest and upper abdomen.  Metallic device overlies the lateral lower right ribs, presumably extrinsic. Catheter overlies the right pelvis. Otherwise no radiopaque foreign bodies. No dilated small bowel loops. No evidence of pneumatosis or pneumoperitoneum. IMPRESSION: Catheter overlies the right pelvis. EKG leads overlie the lower chest and upper abdomen. Metallic device overlies the lateral lower right ribs, presumably extrinsic. Otherwise no radiopaque foreign bodies. Excreted contrast fills the urinary bladder and renal collecting systems. Nonobstructive bowel gas pattern. Electronically Signed   By: Ilona Sorrel M.D.   On: 06/25/2019 20:54   ECHOCARDIOGRAM COMPLETE  Result Date: 06/18/2019    ECHOCARDIOGRAM REPORT   Patient Name:   GERALDEAN WALEN Date of Exam: 06/18/2019 Medical Rec #:  [id number]        Height:       63.0 in Accession #:    [id number]       Weight:       129.9 lb Date of Birth:  1925-11-27       BSA:          1.610 m Patient Age:    44 years         BP:           124/64 mmHg Patient Gender: F                HR:           66 bpm. Exam Location:  Inpatient Procedure: 2D Echo, Cardiac Doppler and Color Doppler Indications:    Stroke 434.91 / I163.9  History:        Patient has no prior history of Echocardiogram examinations.  Sonographer:    Jonelle Sidle Dance Referring Phys: [place]  1. Left ventricular ejection fraction, by estimation, is 55 to 60%. The left ventricle has normal function. The left ventricle has no regional wall motion abnormalities. Left ventricular diastolic function could not be evaluated.  2. Right ventricular systolic function is normal. The right ventricular size is normal. There is moderately elevated  pulmonary artery systolic pressure.  3. Left atrial size was severely dilated.  4. Right atrial size was moderately dilated.  5. The mitral valve is grossly normal. Mild to moderate mitral valve regurgitation. No evidence of mitral stenosis.  6. The aortic valve is grossly normal. Aortic valve regurgitation is not visualized. Moderate aortic valve stenosis. FINDINGS  Left Ventricle: Left ventricular ejection fraction, by estimation, is 55 to 60%. The left ventricle has normal function. The left ventricle has no regional wall motion abnormalities. The left ventricular internal cavity size was normal in size. There is  no left ventricular hypertrophy. Left ventricular diastolic function could not be evaluated due to atrial fibrillation. Left ventricular diastolic function could not be evaluated. Right Ventricle: The right ventricular size is normal. No increase in right ventricular wall thickness. Right ventricular systolic function is normal. There is moderately elevated pulmonary artery systolic pressure. The tricuspid regurgitant velocity is 2.93 m/s, and with an assumed right atrial pressure of 15 mmHg, the estimated right ventricular systolic pressure is 37.0 mmHg. Left Atrium: Left atrial size was severely dilated. Right Atrium: Right atrial size was moderately dilated. Pericardium: There is no evidence of pericardial effusion. Mitral Valve: The mitral valve is grossly normal. Mild to moderate mitral valve regurgitation. No evidence of mitral valve stenosis. Tricuspid Valve: The tricuspid valve is grossly normal. Tricuspid valve regurgitation is mild . No evidence of tricuspid stenosis. Aortic Valve: The aortic valve is grossly normal.. There is mild thickening and mild calcification of  the aortic valve. Aortic valve regurgitation is not visualized. Moderate aortic stenosis is present. There is mild thickening of the aortic valve. There  is mild calcification of the aortic valve. Aortic valve mean gradient  measures 20.8 mmHg. Aortic valve peak gradient measures 37.2 mmHg. Aortic valve area, by VTI measures 0.69 cm. Pulmonic Valve: The pulmonic valve was normal in structure. Pulmonic valve regurgitation is not visualized. No evidence of pulmonic stenosis. Aorta: The aortic root and ascending aorta are structurally normal, with no evidence of dilitation. IAS/Shunts: The atrial septum is grossly normal.  LEFT VENTRICLE PLAX 2D LVIDd:         4.41 cm LVIDs:         2.90 cm LV PW:         1.00 cm LV IVS:        0.79 cm LVOT diam:     1.90 cm LV SV:         46 LV SV Index:   29 LVOT Area:     2.84 cm  RIGHT VENTRICLE          IVC RV Basal diam:  2.97 cm  IVC diam: 2.28 cm RV Mid diam:    2.17 cm TAPSE (M-mode): 1.2 cm LEFT ATRIUM              Index       RIGHT ATRIUM           Index LA diam:        4.70 cm  2.92 cm/m  RA Area:     25.10 cm LA Vol (A2C):   125.0 ml 77.66 ml/m RA Volume:   78.60 ml  48.83 ml/m LA Vol (A4C):   83.9 ml  52.13 ml/m LA Biplane Vol: 104.0 ml 64.62 ml/m  AORTIC VALVE AV Area (Vmax):    0.65 cm AV Area (Vmean):   0.63 cm AV Area (VTI):     0.69 cm AV Vmax:           304.80 cm/s AV Vmean:          214.200 cm/s AV VTI:            0.663 m AV Peak Grad:      37.2 mmHg AV Mean Grad:      20.8 mmHg LVOT Vmax:         70.20 cm/s LVOT Vmean:        47.550 cm/s LVOT VTI:          0.162 m LVOT/AV VTI ratio: 0.25  AORTA Ao Root diam: 3.30 cm Ao Asc diam:  3.40 cm MITRAL VALVE                TRICUSPID VALVE MV Area (PHT): 4.07 cm     TR Peak grad:   34.3 mmHg MV Decel Time: 187 msec     TR Vmax:        293.00 cm/s MV E velocity: 117.00 cm/s                             SHUNTS                             Systemic VTI:  0.16 m  Systemic Diam: 1.90 cm Mertie Moores MD Electronically signed by Mertie Moores MD Signature Date/Time: 06/18/2019/4:51:10 PM    Final    IR PERCUTANEOUS ART THROMBECTOMY/INFUSION INTRACRANIAL INC DIAG ANGIO  Result Date: 06/17/2019 INDICATION:  86 year old female with past medical history significant for chronic atrial fibrillation, pulmonary hypertension, essential hypertension, hyperlipidemia, chronic kidney disease stage 3, O2 dependency at night, glaucoma and sacral fracture with a baseline modified Rankin scale 0. She was found in the afternoon with left facial droop, severe dysarthria and left-sided paralysis. The last known well was 6 a.m. on 06/19/2019. She was brought to emergency and was found to have a right M1/MCA occlusion. NIHSS was 19. Head CT showed hypodensity of the right basal ganglia. CT perfusion showed a core infarct of 10 mL with a penumbra of 97 mL. She was taken to our service for emergency diagnostic cerebral angiogram and mechanical thrombectomy. EXAM: IR CT HEAD LIMITED; IR PERCUTANEOUS ART THORMBECTOMY/INFUSION INTRACRANIAL INCLUDE DIAG ANGIO COMPARISON:  CT/CT angiogram of the head and neck June 16, 2019. MEDICATIONS: No antibiotics given. ANESTHESIA/SEDATION: The procedure was performed under general anesthesia provided by the Department of Anesthesiology. FLUOROSCOPY TIME:  Fluoroscopy Time: 10 minutes 12 seconds (400 mGy). COMPLICATIONS: None immediate. TECHNIQUE: Informed written consent was obtained from the patient's son after a thorough discussion of the procedural risks, benefits and alternatives. All questions were addressed. Maximal Sterile Barrier Technique was utilized including caps, mask, sterile gowns, sterile gloves, sterile drape, hand hygiene and skin antiseptic. A timeout was performed prior to the initiation of the procedure. The right groin was prepped and draped in the usual sterile fashion. Using a micropuncture kit and the modified Seldinger technique, access was gained to the right common femoral artery and an 8 French sheath was placed. Then, an [place] balloon guide catheter was navigated over a 6 [place] Berenstein 2 catheter and a 0.035 inch Terumo Glidewire into the aortic arch. Under  fluoroscopy, the catheter was placed into the right common carotid artery. Frontal and lateral angiograms of the neck were obtained. The catheter was then advanced into the distal cervical segment of the right ICA. Frontal and lateral angiograms of the head were obtained. FINDINGS: 1. A right carotid web is noted, without flow limitation. 2. Mild luminal irregularity is noted in the bulb and along the cervical right ICA, suggestive of mild atherosclerotic disease. 3. Complete occlusion of the right MCA is noted at the mid M1 segment. 4. Poor collateral flow from the right ACA to the right MCA territory is seen. 5. Patent bilateral ACAs. PROCEDURE: Under biplane roadmap, a Vecta 71 aspiration catheter was navigated over a phenom 21 microcatheter and a synchro support microguidewire into the cavernous segment of the right ICA. The microcatheter was then navigated over the wire into the right M2/MCA posterior division branch. Then, a 6 x 40 mm solitaire stent retriever was deployed spanning the mid to distal M1 and M2 segments. The device was allowed to intercalated with the clot for 4 minutes. The microcatheter was removed. The aspiration catheter was advanced to the level of occlusion and connected to a penumbra aspiration pump. The guiding catheter balloon was inflated. The thrombectomy device and aspiration catheter were removed under constant aspiration. Follow-up frontal and lateral angiograms of the head were obtained viable guide catheter contrast injection. Complete recanalization of the right MCA vascular tree was achieved (TICI 3). Flat panel CT of the head was obtained and post processed in a separate workstation with concurrent attending physician supervision.  Selected images were sent to PACS. Mild contrast staining of the right basal ganglia noted. No hemorrhagic complication seen. The catheter was subsequently withdrawn. The right common femoral artery angiogram was obtained with frontal and lateral  views via sheath side port. Prominent tortuosity of the the right external iliac noted. The puncture is at the level of the common femoral artery. The sheath was then exchanged over the wire for an 8 [place] Angio-Seal which was utilized for access closure. Immediate hemostasis was achieved. IMPRESSION: 1. Successful and uncomplicated mechanical thrombectomy performed with combined technique (aspiration + stent retriever) for treatment of a right MCA/M1 occlusion. Complete recanalization achieved. 2. No embolus to new territory. 3. Contrast staining of the right basal ganglia noted on postprocedural flat panel CT, likely related to ongoing ischemia. No subarachnoid hemorrhage. PLAN: Patient remained intubated given unknown COVID-19 status and was sent to ICU for continued care. Electronically Signed   By: Pedro Earls M.D.   On: 06/17/2019 15:28   CT HEAD CODE STROKE WO CONTRAST  Result Date: 07/07/2019 CLINICAL DATA:  Code stroke. Ataxia, stroke suspected. Additional history provided: Aphasia. EXAM: CT HEAD WITHOUT CONTRAST TECHNIQUE: Contiguous axial images were obtained from the base of the skull through the vertex without intravenous contrast. COMPARISON:  Brain MRI 01/31/2016, head CT 01/15/2016 FINDINGS: Brain: There is abnormal hypodensity consistent with acute ischemic infarction involving the right caudate and lentiform nuclei as well as right internal capsule and portions of the lateral right thalamus. There is also probable subtle loss of gray-white differentiation within the anterior right temporal lobe. There is no evidence of acute intracranial hemorrhage Mild ill-defined hypoattenuation within the cerebral white matter is nonspecific, but consistent with chronic small vessel ischemic disease. Mild generalized parenchymal atrophy. No extra-axial fluid collection. No evidence of intracranial mass. No midline shift. Vascular: Subtle hyperdensity is questioned in the region of the  distal M1 right MCA. Skull: Normal. Negative for fracture or focal lesion. Sinuses/Orbits: Visualized orbits show no acute finding. Mild paranasal sinus mucosal thickening. No significant mastoid effusion. ASPECTS ([place] Stroke Program Early CT Score) - Ganglionic level infarction (caudate, lentiform nuclei, internal capsule, insula, M1-M3 cortex): 3 - Supraganglionic infarction (M4-M6 cortex): 3 Total score (0-10 with 10 being normal): 6 IMPRESSION: Acute ischemic infarction changes involving the right basal ganglia, internal capsule and anterior right temporal lobe. ASPECTS is 6. Subtle hyperdensity is questioned in the region of the distal M1 right MCA, which may reflect thrombus. Correlate with pending CTA head/neck No evidence of acute intracranial hemorrhage. Background mild generalized parenchymal atrophy and chronic small vessel ischemic disease. Electronically Signed   By: Kellie Simmering DO   On: 06/27/2019 17:50   CT ANGIO HEAD CODE STROKE  Result Date: 06/18/2019 CLINICAL DATA:  Neuro deficit, acute, stroke suspected. EXAM: CT ANGIOGRAPHY HEAD AND NECK CT PERFUSION BRAIN TECHNIQUE: Multidetector CT imaging of the head and neck was performed using the standard protocol during bolus administration of intravenous contrast. Multiplanar CT image reconstructions and MIPs were obtained to evaluate the vascular anatomy. Carotid stenosis measurements (when applicable) are obtained utilizing NASCET criteria, using the distal internal carotid diameter as the denominator. Multiphase CT imaging of the brain was performed following IV bolus contrast injection. Subsequent parametric perfusion maps were calculated using RAPID software. CONTRAST:  100 mL Omnipaque 350 intravenous contrast COMPARISON:  Noncontrast head CT performed earlier the same day 07/05/2019 FINDINGS: CTA NECK FINDINGS Aortic arch: The left internal carotid artery appears to originate beyond the origin of the  left subclavian takeoff.  Atherosclerotic calcification within the visualized aortic arch and proximal major branch vessels of the neck. No hemodynamically significant innominate or proximal subclavian artery stenosis. Right carotid system: CCA and ICA patent within the neck without significant stenosis (50% or greater). Mild atherosclerotic plaque within the carotid bifurcation. Left carotid system: CCA and ICA patent within the neck without significant stenosis (50% or greater). Mild atherosclerotic plaque within the carotid bifurcation. Vertebral arteries: Codominant and patent within the neck without significant stenosis. 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 Skeleton: Numerous sclerotic lesions within the cervical and visualized thoracic spine suspicious for osseous metastatic disease. Cervical spondylosis. Other neck: No neck mass or cervical lymphadenopathy. Upper chest: Partially imaged bilateral pleural effusions (greater on the right with associated compressive atelectasis. Review of the MIP images confirms the above findings CTA HEAD FINDINGS Anterior circulation: The intracranial internal carotid arteries are patent without significant stenosis. Mild calcified plaque within these vessels. There is abrupt occlusion of the distal M1 right middle cerebral artery. There is non opacification of the adjacent proximal right M2 branch vessels. There is poor opacification of distal M2 and more distal right MCA branch vessels. The M1 left middle cerebral artery is patent without significant stenosis. No left M2 proximal branch occlusion is identified. The anterior cerebral arteries are patent without high-grade proximal stenosis. No intracranial aneurysm is identified. Posterior circulation: The intracranial vertebral arteries are patent without significant stenosis, as is the basilar artery. The posterior cerebral arteries are  patent proximally without significant stenosis. Posterior communicating arteries are present bilaterally. Venous sinuses: Within limitations of contrast timing, no convincing thrombus. Anatomic variants: As described Review of the MIP images confirms the above findings CT Brain Perfusion Findings: ASPECTS: 6 CBF (<30%) Volume: 30m within the right MCA vascular territory Perfusion (Tmax>6.0s) volume: 1021mMismatch Volume: 9781mnfarction Location:Right MCA vascular territory These results were communicated to Dr. LinCheral Marker 6:06 pmon 06/07/2021by text page via the [facility]aging system. IMPRESSION: CTA neck: 1. The common carotid, internal carotid and vertebral arteries are patent within the neck without hemodynamically significant stenosis. Mild atherosclerotic plaque within the carotid bifurcations bilaterally. 2. The left internal carotid artery appears to originate distal to the left subclavian takeoff. 3. 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 4. Multifocal sclerotic lesions within the cervical and visualized thoracic spine suspicious for osseous metastatic disease. 5. Partially imaged pleural effusions (greater on the right). CTA head: Abrupt occlusion of the distal M1 right middle cerebral artery. There is non opacification of adjacent proximal M2 right MCA branch vessels. There is poor opacification of more distal right MCA branch vessels. CT perfusion head: The perfusion software detects a 10 mL region of core infarction within the right MCA vascular territory, which is likely underestimated given findings on noncontrast head CT performed earlier the same day. The perfusion software detects a 107 mL region of hypoperfusion within the right MCA vascular territory utilizing a Tmax>6 seconds threshold. Reported mismatch volume: 97 mL. Electronically Signed   By: KylKellie Simmering   On: 07/12/2019 18:16   CT ANGIO NECK CODE  STROKE  Result Date: 07/07/2019 CLINICAL DATA:  Neuro deficit, acute, stroke suspected. EXAM: CT ANGIOGRAPHY HEAD AND NECK CT PERFUSION BRAIN TECHNIQUE: Multidetector CT imaging of the head and neck was performed using the standard protocol during bolus administration  of intravenous contrast. Multiplanar CT image reconstructions and MIPs were obtained to evaluate the vascular anatomy. Carotid stenosis measurements (when applicable) are obtained utilizing NASCET criteria, using the distal internal carotid diameter as the denominator. Multiphase CT imaging of the brain was performed following IV bolus contrast injection. Subsequent parametric perfusion maps were calculated using RAPID software. CONTRAST:  100 mL Omnipaque 350 intravenous contrast COMPARISON:  Noncontrast head CT performed earlier the same day 06/17/2019 FINDINGS: CTA NECK FINDINGS Aortic arch: The left internal carotid artery appears to originate beyond the origin of the left subclavian takeoff. Atherosclerotic calcification within the visualized aortic arch and proximal major branch vessels of the neck. No hemodynamically significant innominate or proximal subclavian artery stenosis. Right carotid system: CCA and ICA patent within the neck without significant stenosis (50% or greater). Mild atherosclerotic plaque within the carotid bifurcation. Left carotid system: CCA and ICA patent within the neck without significant stenosis (50% or greater). Mild atherosclerotic plaque within the carotid bifurcation. Vertebral arteries: Codominant and patent within the neck without significant stenosis. 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 Skeleton: Numerous sclerotic lesions within the cervical and visualized thoracic spine suspicious for osseous metastatic disease. Cervical spondylosis. Other neck: No neck mass or cervical lymphadenopathy. Upper  chest: Partially imaged bilateral pleural effusions (greater on the right with associated compressive atelectasis. Review of the MIP images confirms the above findings CTA HEAD FINDINGS Anterior circulation: The intracranial internal carotid arteries are patent without significant stenosis. Mild calcified plaque within these vessels. There is abrupt occlusion of the distal M1 right middle cerebral artery. There is non opacification of the adjacent proximal right M2 branch vessels. There is poor opacification of distal M2 and more distal right MCA branch vessels. The M1 left middle cerebral artery is patent without significant stenosis. No left M2 proximal branch occlusion is identified. The anterior cerebral arteries are patent without high-grade proximal stenosis. No intracranial aneurysm is identified. Posterior circulation: The intracranial vertebral arteries are patent without significant stenosis, as is the basilar artery. The posterior cerebral arteries are patent proximally without significant stenosis. Posterior communicating arteries are present bilaterally. Venous sinuses: Within limitations of contrast timing, no convincing thrombus. Anatomic variants: As described Review of the MIP images confirms the above findings CT Brain Perfusion Findings: ASPECTS: 6 CBF (<30%) Volume: 7m within the right MCA vascular territory Perfusion (Tmax>6.0s) volume: 1063mMismatch Volume: 9768mnfarction Location:Right MCA vascular territory These results were communicated to Dr. LinCheral Marker 6:06 pmon 6/1/2021by text page via the [facility]aging system. IMPRESSION: CTA neck: 1. The common carotid, internal carotid and vertebral arteries are patent within the neck without hemodynamically significant stenosis. Mild atherosclerotic plaque within the carotid bifurcations bilaterally. 2. The left internal carotid artery appears to originate distal to the left subclavian takeoff. 3. 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 4. Multifocal sclerotic lesions within the cervical and visualized thoracic spine suspicious for osseous metastatic disease. 5. Partially imaged pleural effusions (greater on the right). CTA head: Abrupt occlusion of the distal M1 right middle cerebral artery. There is non opacification of adjacent proximal M2 right MCA branch vessels. There is poor opacification of more distal right MCA branch vessels. CT perfusion head: The perfusion software detects a 10 mL region of core infarction within the right MCA vascular territory, which is likely underestimated given findings on  noncontrast head CT performed earlier the same day. The perfusion software detects a 107 mL region of hypoperfusion within the right MCA vascular territory utilizing a Tmax>6 seconds threshold. Reported mismatch volume: 97 mL. Electronically Signed   By: Kellie Simmering DO   On: 07/05/2019 18:16      HISTORY OF PRESENT ILLNESS ROSABEL SERMENO is an 86 y.o. female who was in her usual state of health on awakening at 6 AM today 06/16/2010 (LKW). Her son left the house then returned in the afternoon and found her with left facial droop, severe dysarthria and left sided paralysis. EMS was called and on arrival noted the same deficits. A Code Stroke was called and she was emergently transported to the [place] ED. On arrival she had severe dysarthria, left facial droop and left hemiplegia. She was able to answer basic orientation questions. She has a history of chronic atrial fibrillation, with rate controlled; she is not on a blood thinner due to falls risk. Regarding her ADL's, she is fully independent at home and walks on her own. Home medications include ASA and atorvastatin. tPA was not given as out of time window. MRS: 0.  NIHSS: 18. Found to have a R M1 occlusion and taken to IR for mechanical thrombectomy.  HOSPITAL COURSE Ms. ALETTA EDMUNDS  is a 86 y.o. female with history of AF not on AC d/t fall risk, HTN, HLD, CKD presenting with left facial droop, severe dysarthria and left sided paralysis. Not a tPA candidate d/t time window. Sent to IR w/ mid R M1 occlusion.  Stroke:  R MCA infarct s/p IR w/ TICI3 revascularization and hemorrhagic transformation, infarct embolic secondary to known AF not on AC  CT head R basal ganglia, internal capsule, anterior R temporal lobe infarct. R M1 subtle hypodensity, ? Thrombus. No hemorrhage. ASPECTS 6    CTA head distal R M1 occlusion w/ no flow proximal  R M2 and poorly seen distal R MCA branches  CTA neck B ICA bifurcation atherosclerosis. 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 Multifocal sclerotic lesions cervical and thoracic spine suspiciou for osseous metastatic dz. R>L pleural effusions.   CT perfusion R MCA 162mcore infarct. 1074mypoperfusion. Mismatch 65m53mCerebral angio mid R M1 occlusion s/p mechanical thrombectomy w/ TICI3 revascularization   Post IR CT no hemorrhage    MRI  Evolving R MCA territory infarct w/ possible hemorrhagic transformation R lentiform nucleus. R ICA siphon flow void, possible reocclusion.   CT head  Evolving R basal ganglia infarct w/ R lentiform nucleus hemorrhagic transformation. Mild edema.   CT repeat 6/2 combination of HT and contrast staining at right BG and caudate. Similar edema and mass effect   CT - repeat 06/20/19 - Normal expected interval evolution of subacute right MCA territory infarct with associated hemorrhagic transformation at the right basal ganglia.   2D Echo EF 55-60%   aspirin 81 mg daily prior to admission, not treated with antithrombotics in hospital due to hemorrhagic conversion post IR  Acute Respiratory failure d/t encephalopathy, stroke  Intubated for IR, left intubated for airway protection  Off sedation   CCM onboard   Family stated that pt would not want trach or PEG but hopeful for  extubation   One-way Extubation 6/6 at 1732  Became uncomfortable during the night with hypoxia. CCM spoke w/ dtr who changed to NCB and comfort care  Atrial Fibrillation  Home anticoagulation:  none d/t  fall risk   Mild RVR, resolved  Rate mostly controlled now - occasionally >100  Resumed home Cardizem 60 mg Q 6 hrs  Resumed metoprolol 25 bid   Hypertensive Urgency Mild hypotension   SBP 164/109 on arrival  Home meds:  Diltiazem 240, lasix 20, metoprolol 25, spironolactone 25   Treated with Cleviprex, now off  Resumed home cardizem  Resumed metoprolol 25->12.5->25 bid   Goal SBP < 160 given hemorrhagic transformation   Hyperlipidemia  Home meds:  liptitor 20   LDL 87, goal < 70  Resumed lipitor 20 - no high dose statin due to advanced age and relatively low LDL  Prediabetes vs Diabetes type II   Home meds:  None  HgbA1c 6.6, at goal < 7.0  Dysphagia Protein-Calorie Malnutrition, severe  Secondary to stroke  NPO  TF via [place] _0   Gentle IVF _1   Speech on board   Other Stroke Risk Factors  Advanced age  SSS  On estrace vaginal cream PTA  Other Active Problems  Glaucoma on timoptic   Osteopenia on fosamax PTA  Hypocalcemia   Hypophosphatemia - improving  Hypothyroidism on synthroid   Mild leukocytosis - 11.2 (afebrile)  CKD  DEATH  06-26-19 at 0311   Secondary to respiratory failure following R MCA infarct s/p IR w/ TICI3 revascularization and hemorrhagic transformation, infarct embolic secondary to known AF not on AC   25 minutes were spent preparing summary  Burnetta Sabin, MSN, APRN, ANVP-BC, AGPCNP-BC Advanced [facility] Stroke Nurse [place] for Schedule & Pager information 06/25/2019 5:22 PM

## 2019-07-16 NOTE — Progress Notes (Addendum)
[facility] Progress Note Patient Name: Danielle Malone DOB: 12-09-1925 MRN: [id number]   Date of Service  06-23-2019  HPI/Events of Note  Notified by RN that patient seems uncomfortable despite getting morphine hourly.  She was also more hypoxic and now on non rebreather. She appeared very frail and lethargic.  Spoke with daughter Maudie Mercury with regards to goals of care. She was agreeable to change code status to comfort care.   eICU Interventions  Comfort care orders placed.     Intervention Category Intermediate Interventions: Other:  [name] 2019/06/23, 2:56 AM

## 2019-07-16 NOTE — Progress Notes (Signed)
CDS notified of TOD 03:11, Pt has R/O tissue and eyes

## 2019-07-16 NOTE — Progress Notes (Signed)
Patient expired at 0311. No family present at bedside. The daughter, Maudie Mercury, notified by phone.  Dr Rory Percy notified of current events.

## 2019-07-16 DEATH — deceased

## 2021-10-31 IMAGING — DX DG CHEST 1V PORT
1 series · 1 of 1 positions shown · non-contrast
Comparison: 11/28/2017 chest radiograph.

CLINICAL DATA: Screening for metal prior to MRI

EXAM:
PORTABLE CHEST 1 VIEW

[chest ap]
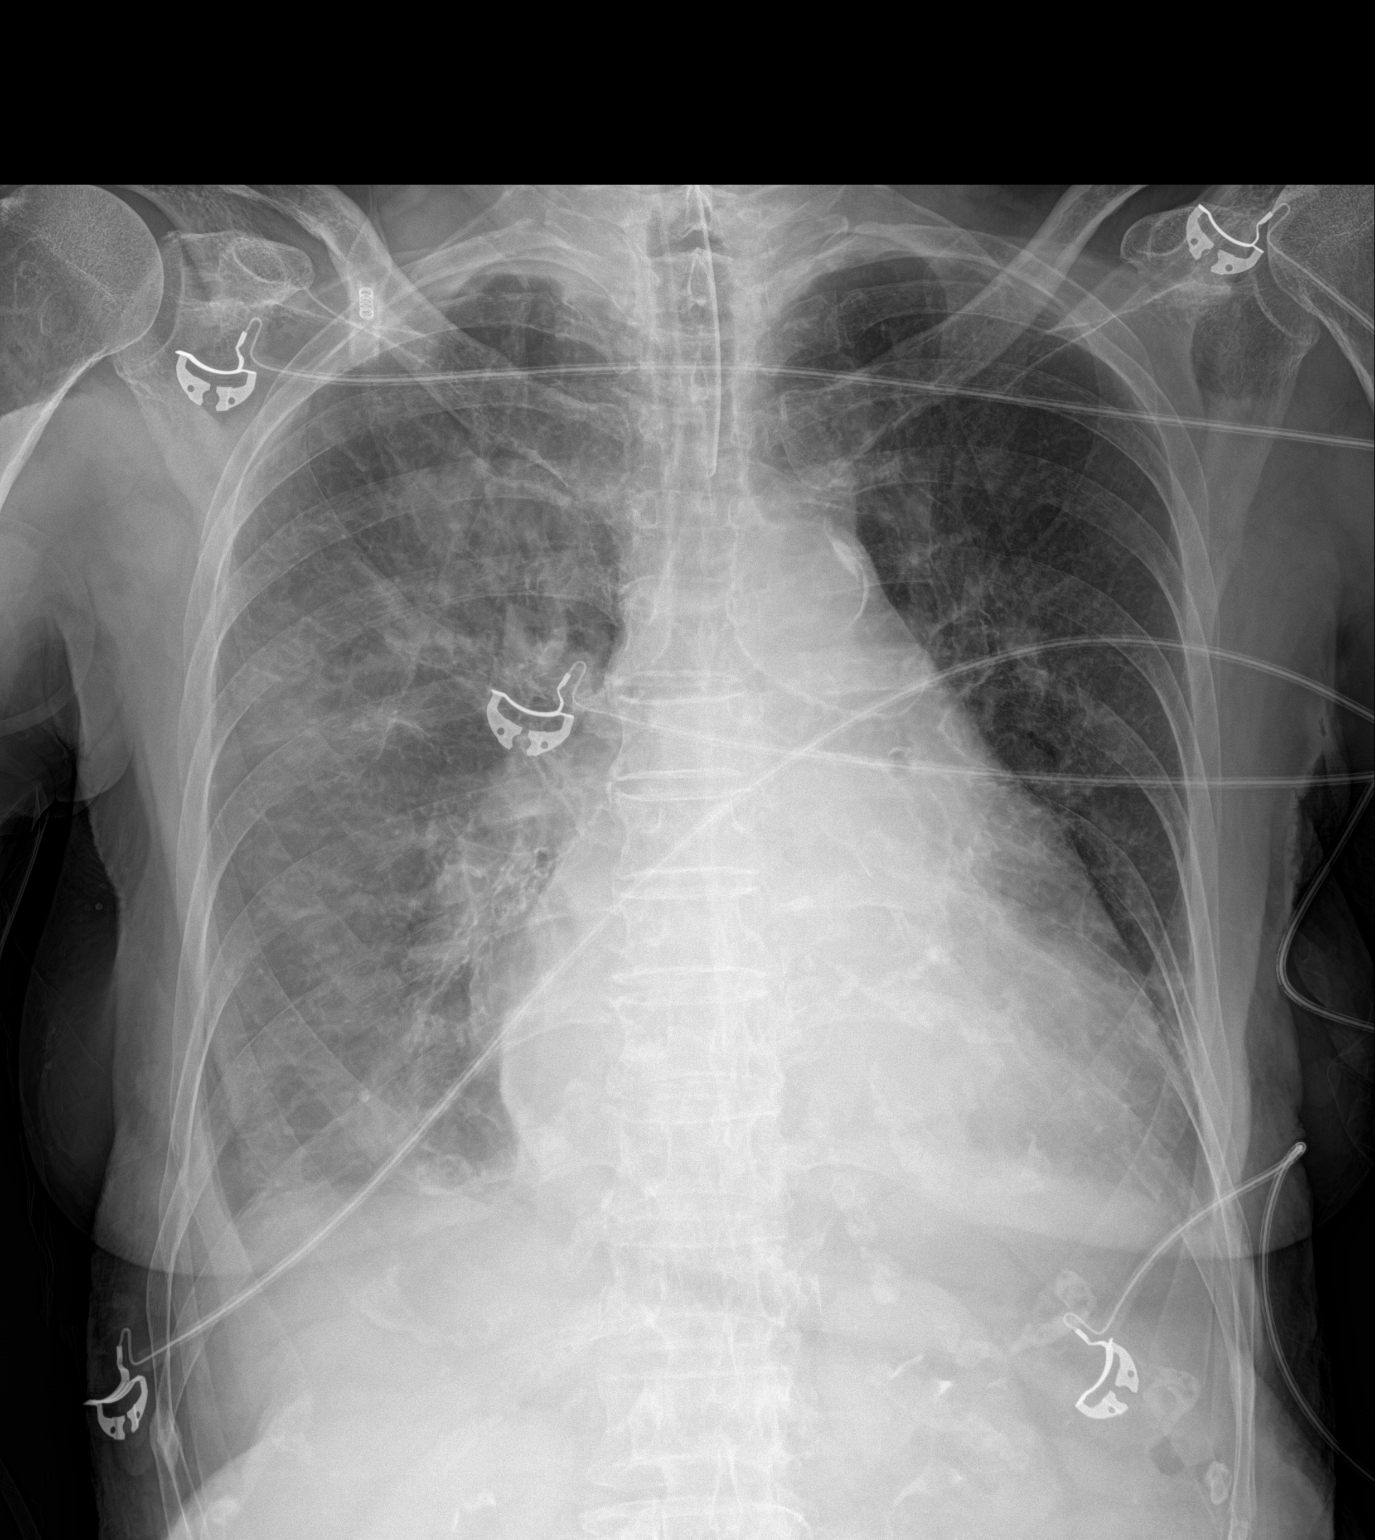

[1 of 1 positions shown; findings below may reference images not displayed]

FINDINGS: Endotracheal tube tip is 4.7 cm above the carina. Several extrinsic
EKG leads overlie the chest bilaterally. Otherwise no radiopaque
foreign bodies. Stable cardiomediastinal silhouette with mild
cardiomegaly. No pneumothorax. Small bilateral pleural effusions.
Mild-to-moderate pulmonary edema.
IMPRESSION: 1. Endotracheal tube tip is 4.7 cm above the carina. Extrinsic EKG
leads overlie the chest bilaterally. Otherwise no radiopaque foreign
bodies.
2. Mild-to-moderate congestive heart failure with small bilateral
pleural effusions.

## 2021-11-01 IMAGING — CT CT HEAD W/O CM
3 series · 14 of 47 positions shown, 16 images · non-contrast
Comparison: 06/16/2019

CLINICAL DATA: Stroke post revascularization, follow-up

EXAM:
CT HEAD WITHOUT CONTRAST
TECHNIQUE: Contiguous axial images were obtained from the base of the skull
through the vertex without intravenous contrast.

[Series 3: head 5.0 h30s · axial · 0.43mm/px · z∈[+1005,+1140]mm · 8 of 33 slices shown, 10 images]
[im 3/33  brain]
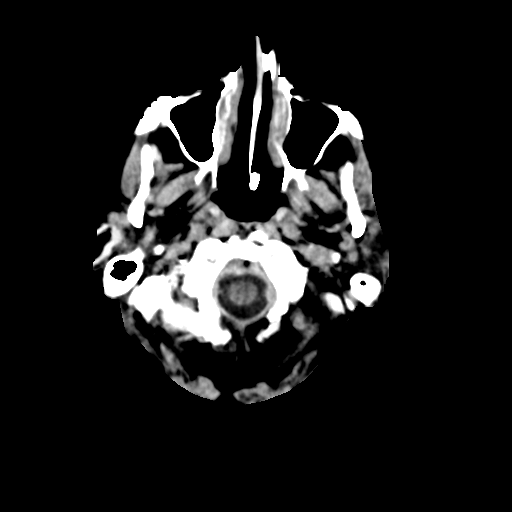
[im 3/33  bone]
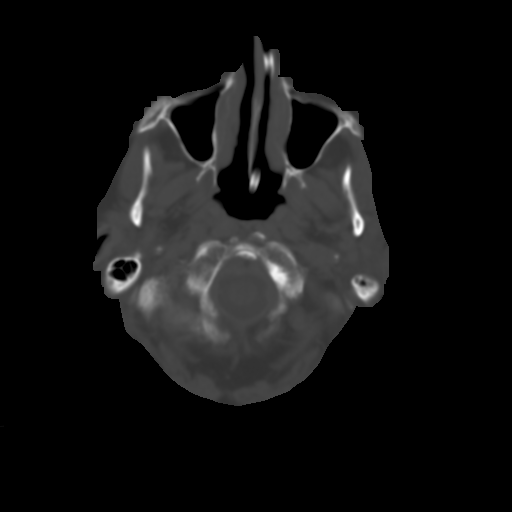
[im 7/33  brain]
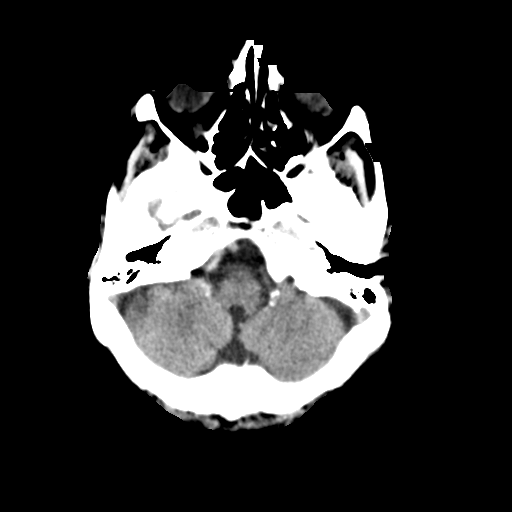
[im 10/33  brain]
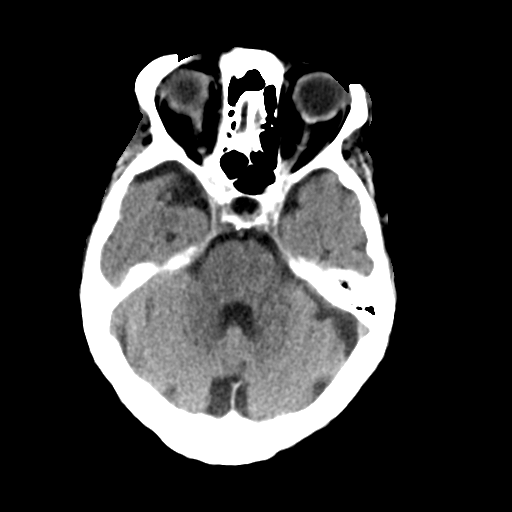
[im 15/33  brain]
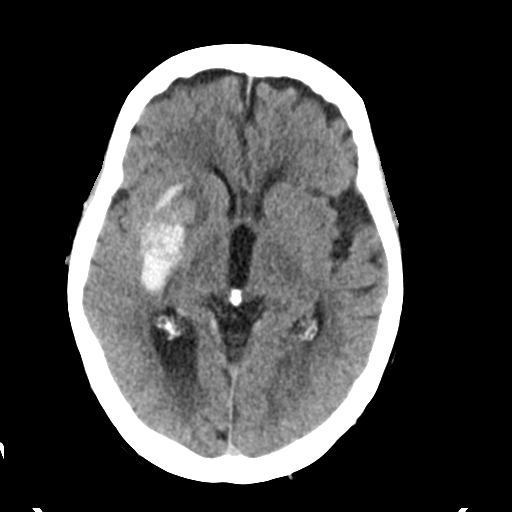
[im 18/33  brain]
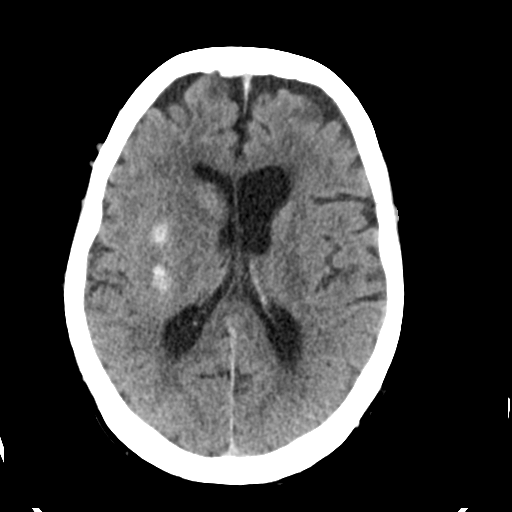
[im 18/33  bone]
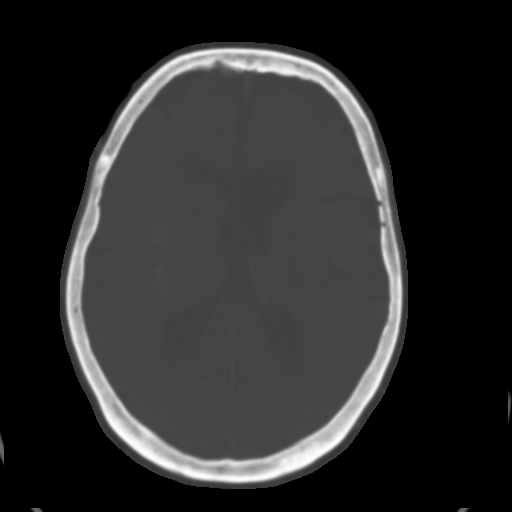
[im 23/33  brain]
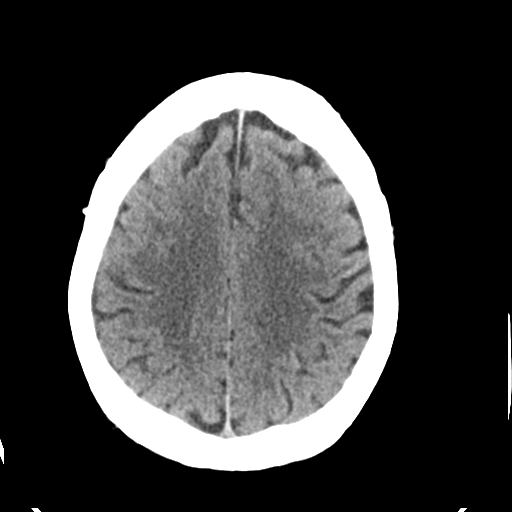
[im 26/33  brain]
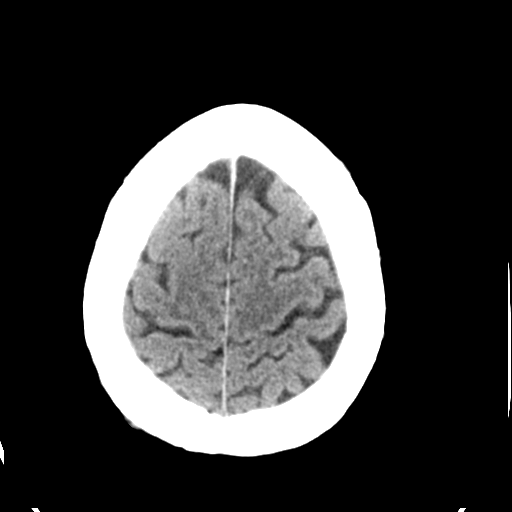
[im 30/33  brain]
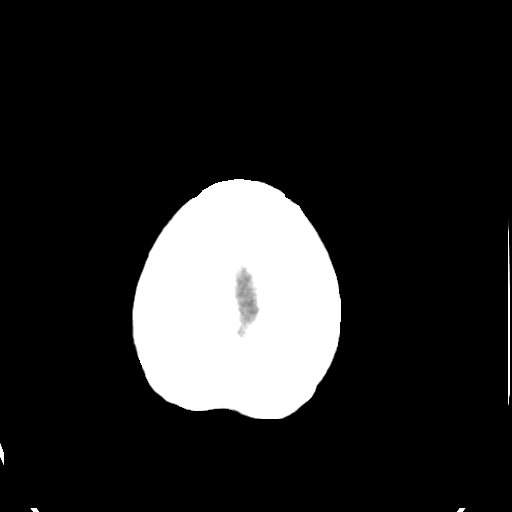

[Series 5: head 3.0 mpr cor · coronal · 0.29mm/px · 3 of 72 slices shown]
[im 24/72  brain]
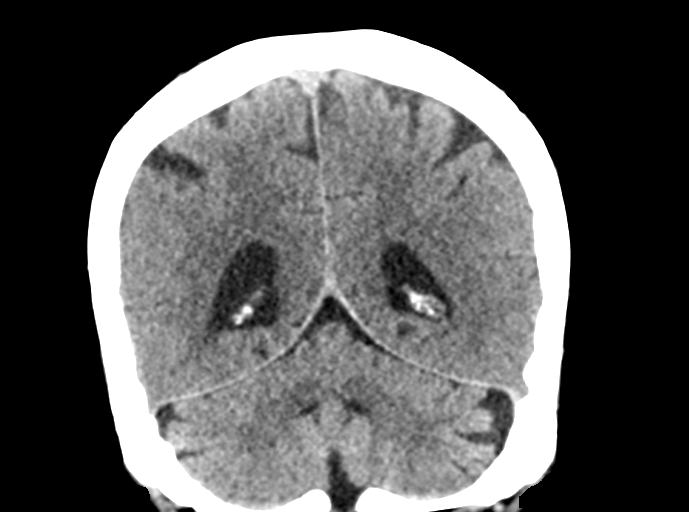
[im 32/72  brain]
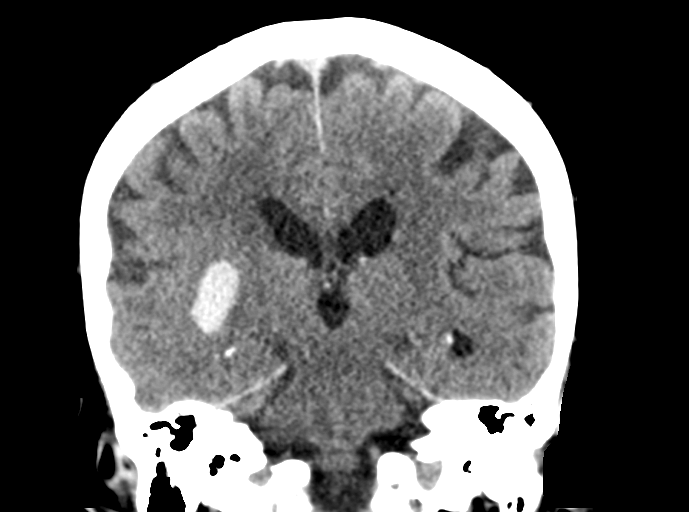
[im 40/72  brain]
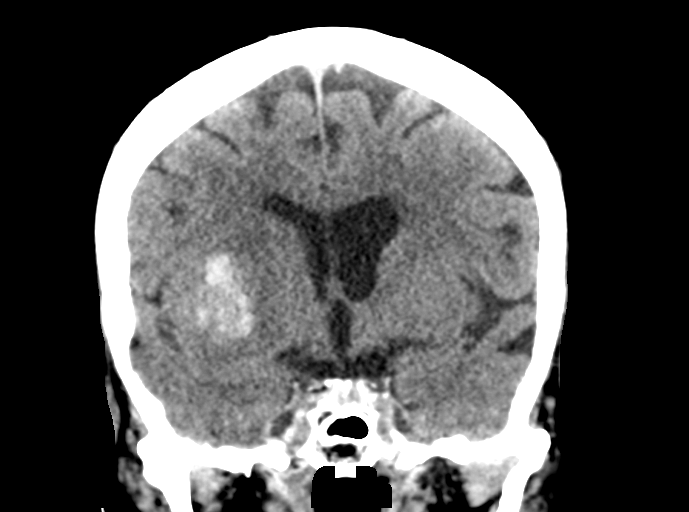

[Series 6: head 3.0 mpr sag · sagittal · 0.33mm/px · 3 of 66 slices shown]
[im 22/66  brain]
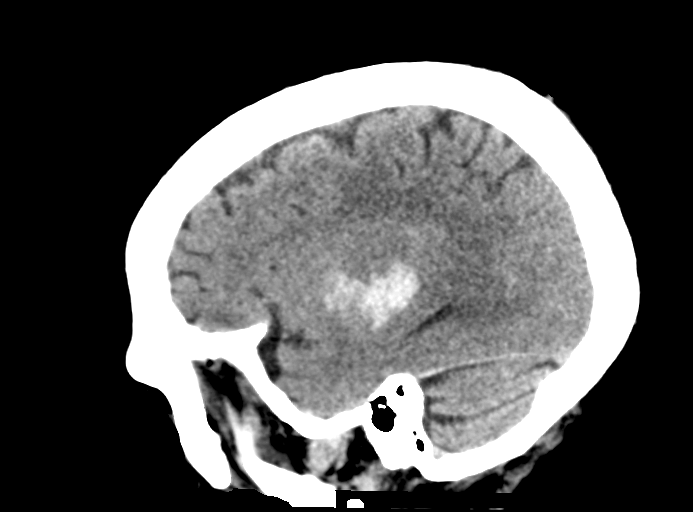
[im 33/66  brain]
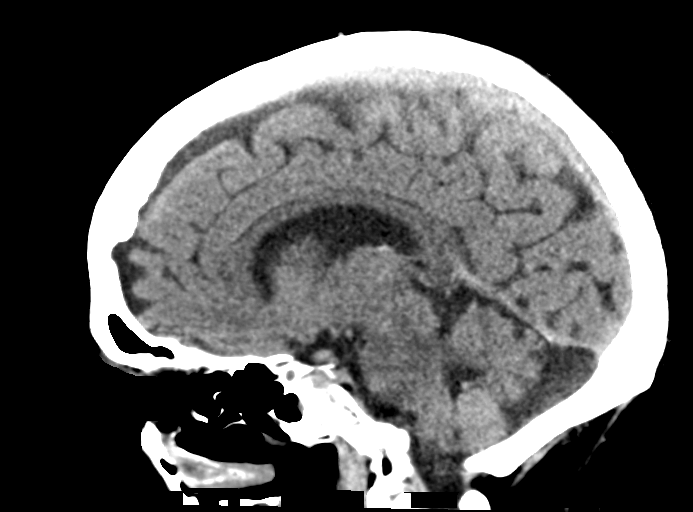
[im 44/66  brain]
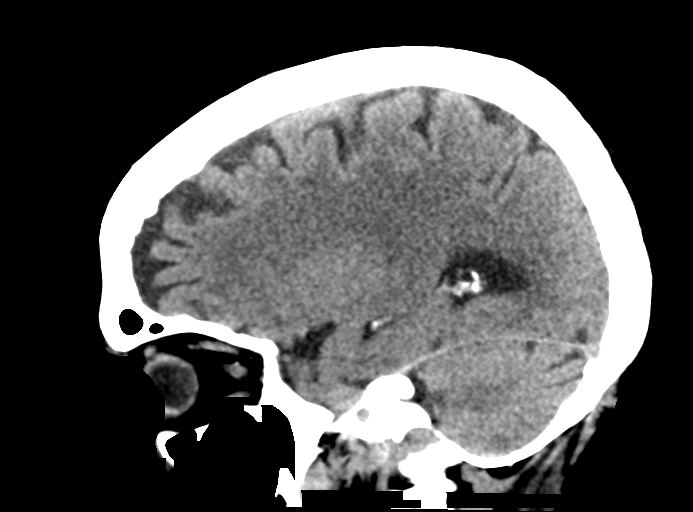

[14 of 47 positions shown; findings below may reference images not displayed]

FINDINGS: Brain: Evolving right MCA territory infarction is again identified
primarily involving the basal ganglia and adjacent white matter.
Hemorrhagic transformation about the right lentiform nucleus is
stable in size with mild associated edema. Contrast staining is also
present in this region with some areas of decreased hyperdensity.
There is decreased hyperdensity along the caudate reflecting
contrast staining superimposed on petechial hemorrhage.

Stable partial effacement of the right lateral ventricle without
midline shift or hydrocephalus. Stable findings of chronic
microvascular ischemic changes.

Vascular: There is atherosclerotic calcification at the skull base.

Skull: Calvarium is unremarkable.

Sinuses/Orbits: No acute finding.

Other: Mastoid air cells are clear.
IMPRESSION: Evolving recent right MCA territory infarction. There is a
combination of hemorrhagic transformation and contrast staining
about the right lentiform nucleus and to a lesser extent along the
caudate. Similar associated edema and mild mass effect.

## 2021-11-03 IMAGING — DX DG CHEST 1V PORT
1 series · 1 of 1 positions shown · non-contrast
Comparison: 06/16/2019

CLINICAL DATA: Stroke

EXAM:
PORTABLE CHEST 1 VIEW

[chest ap]
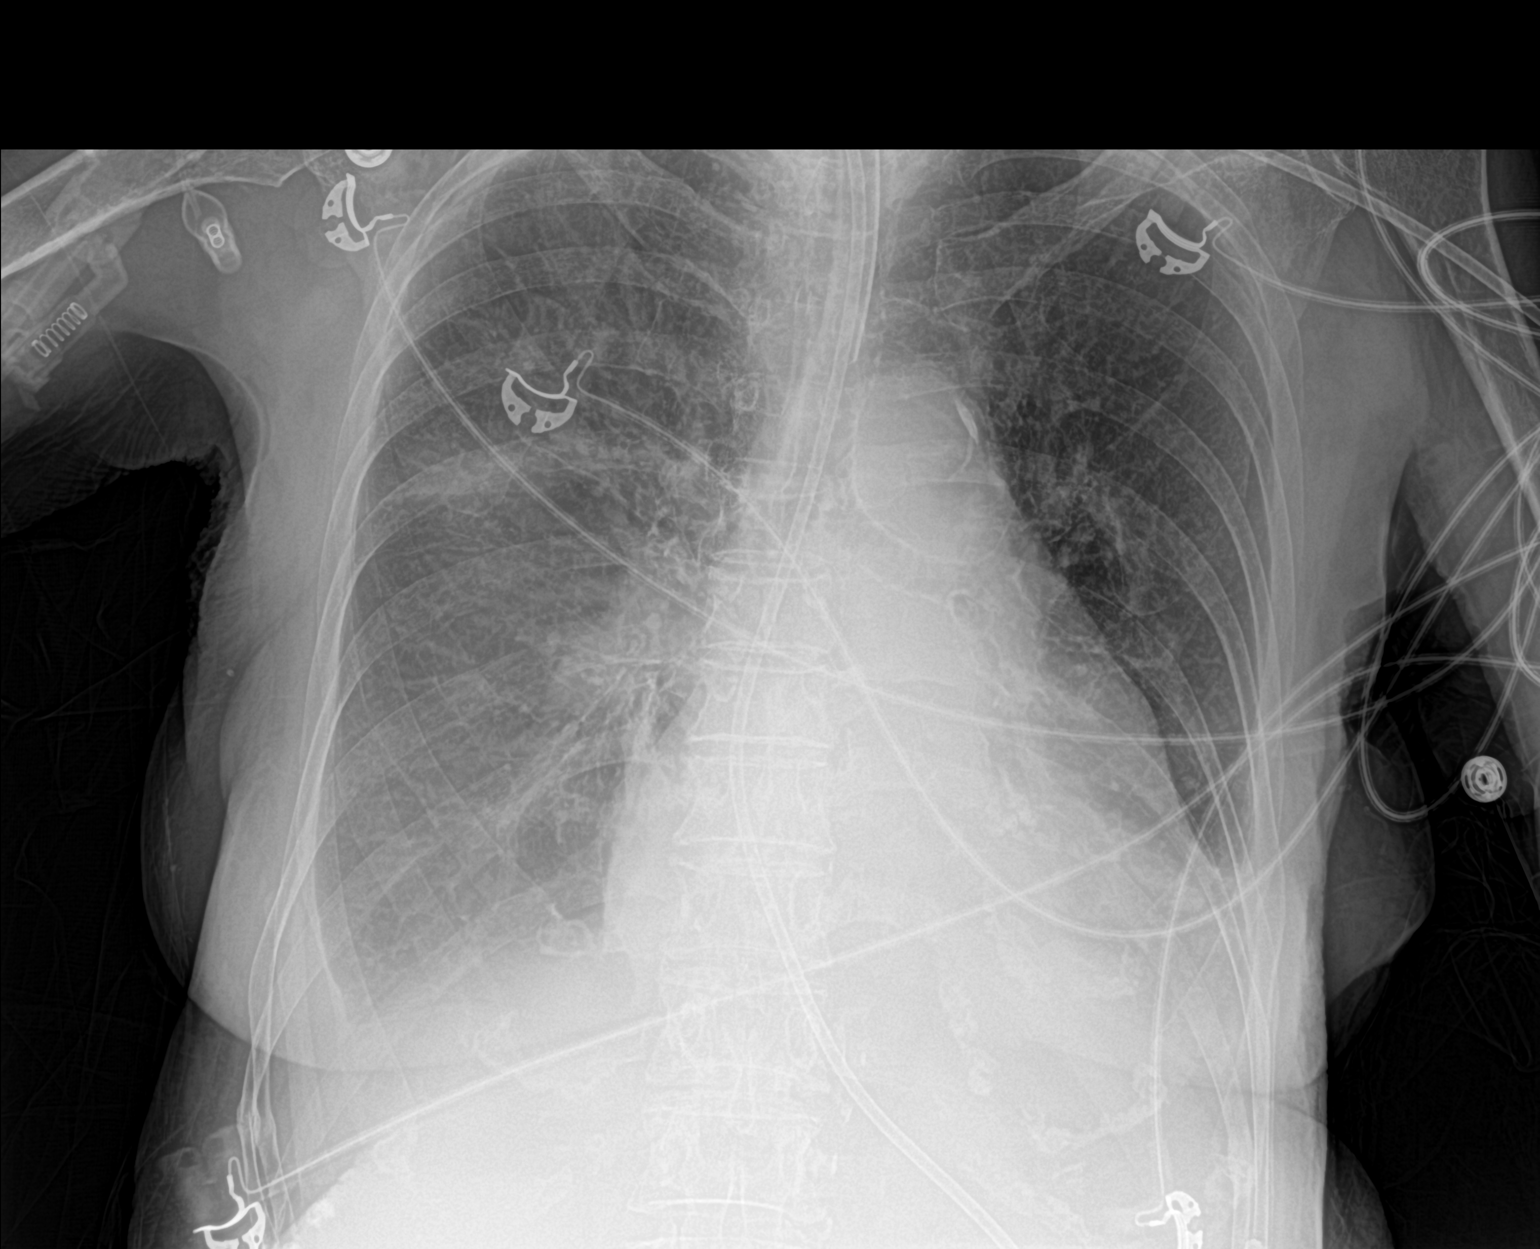

[1 of 1 positions shown; findings below may reference images not displayed]

FINDINGS: Endotracheal tube terminates 4.1 cm above the carina. Enteric tube
courses below the diaphragm with distal tip beyond the inferior
margin of the film.

Stable cardiomegaly. Atherosclerotic calcification of the aortic
knob. Persistent bilateral interstitial prominence with small
bilateral pleural effusions, similar to prior. Osseous structures
are demineralized.
IMPRESSION: Stable appearance of the chest.  Support apparatus, as above.
